# Patient Record
Sex: Male | Born: 1960 | Race: White | Hispanic: No | Marital: Married | Smoking: Former smoker
Health system: Southern US, Community
[De-identification: ages and names within clinical notes are randomized; demographics above are authoritative.]

## PROBLEM LIST (undated history)

## (undated) DIAGNOSIS — L439 Lichen planus, unspecified: Secondary | ICD-10-CM

## (undated) DIAGNOSIS — I10 Essential (primary) hypertension: Secondary | ICD-10-CM

## (undated) HISTORY — DX: Lichen planus, unspecified: L43.9

## (undated) HISTORY — PX: NO PAST SURGERIES: SHX2092

## (undated) HISTORY — DX: Essential (primary) hypertension: I10

---

## 2010-01-11 ENCOUNTER — Ambulatory Visit: Payer: Self-pay | Admitting: Internal Medicine

## 2014-11-11 ENCOUNTER — Other Ambulatory Visit: Payer: Self-pay | Admitting: Family Medicine

## 2014-11-22 ENCOUNTER — Other Ambulatory Visit: Payer: Self-pay | Admitting: Family Medicine

## 2014-12-13 ENCOUNTER — Telehealth: Payer: Self-pay | Admitting: Family Medicine

## 2014-12-13 DIAGNOSIS — I10 Essential (primary) hypertension: Secondary | ICD-10-CM | POA: Insufficient documentation

## 2014-12-13 NOTE — Telephone Encounter (Signed)
Pt called needs refill on Trazadone. Pharm is [facility]. Appt scheduled for 12/21/14 @ 9:45am. Thanks.

## 2014-12-13 NOTE — Telephone Encounter (Signed)
Patient has not been seen in over 1 year, will have to be seen for any medications to be filled

## 2014-12-14 NOTE — Telephone Encounter (Signed)
Pt has appt scheduled for 12/21/14 @ 9:45am. Pt wants to know if enough Trazadone can be called in to last until this appt. Pharm is [facility]. Thanks.

## 2014-12-21 ENCOUNTER — Ambulatory Visit: Payer: Self-pay | Admitting: Family Medicine

## 2014-12-27 ENCOUNTER — Ambulatory Visit: Payer: Self-pay | Admitting: Family Medicine

## 2014-12-28 ENCOUNTER — Ambulatory Visit: Payer: Self-pay | Admitting: Family Medicine

## 2015-02-07 ENCOUNTER — Other Ambulatory Visit: Payer: Self-pay | Admitting: Family Medicine

## 2015-02-07 MED ORDER — LORAZEPAM 1 MG PO TABS: 1.0000 mg | ORAL_TABLET | Freq: Every day | ORAL | Status: DC | PRN

## 2015-02-07 MED ORDER — TRAZODONE HCL 50 MG PO TABS: 25.0000 mg | ORAL_TABLET | Freq: Every evening | ORAL | Status: DC | PRN

## 2015-03-03 ENCOUNTER — Other Ambulatory Visit: Payer: Self-pay | Admitting: Family Medicine

## 2015-05-01 ENCOUNTER — Telehealth: Payer: Self-pay

## 2015-05-01 NOTE — Telephone Encounter (Signed)
No refills patient not seen since 08/2013

## 2015-06-07 ENCOUNTER — Encounter: Payer: Self-pay | Admitting: Family Medicine

## 2015-06-07 ENCOUNTER — Other Ambulatory Visit: Payer: Self-pay | Admitting: Family Medicine

## 2015-06-07 NOTE — Telephone Encounter (Signed)
Letter sent.

## 2015-06-07 NOTE — Telephone Encounter (Signed)
apt 

## 2015-08-17 ENCOUNTER — Encounter: Payer: Self-pay | Admitting: Family Medicine

## 2015-08-17 ENCOUNTER — Ambulatory Visit (INDEPENDENT_AMBULATORY_CARE_PROVIDER_SITE_OTHER): Payer: BLUE CROSS/BLUE SHIELD | Admitting: Family Medicine

## 2015-08-17 VITALS — BP 146/91 | HR 88 | Temp 98.5°F | Ht 69.7 in | Wt 240.0 lb

## 2015-08-17 DIAGNOSIS — Z13220 Encounter for screening for lipoid disorders: Secondary | ICD-10-CM | POA: Diagnosis not present

## 2015-08-17 DIAGNOSIS — Z125 Encounter for screening for malignant neoplasm of prostate: Secondary | ICD-10-CM

## 2015-08-17 DIAGNOSIS — Z1159 Encounter for screening for other viral diseases: Secondary | ICD-10-CM

## 2015-08-17 DIAGNOSIS — I10 Essential (primary) hypertension: Secondary | ICD-10-CM | POA: Diagnosis not present

## 2015-08-17 DIAGNOSIS — G4700 Insomnia, unspecified: Secondary | ICD-10-CM | POA: Insufficient documentation

## 2015-08-17 LAB — UA/M W/RFLX CULTURE, ROUTINE
BILIRUBIN UA: NEGATIVE
Glucose, UA: NEGATIVE
KETONES UA: NEGATIVE
Leukocytes, UA: NEGATIVE
Nitrite, UA: NEGATIVE
PH UA: 5 (ref 5.0–7.5)
PROTEIN UA: NEGATIVE
RBC UA: NEGATIVE
SPEC GRAV UA: 1.01 (ref 1.005–1.030)
Urobilinogen, Ur: 0.2 mg/dL (ref 0.2–1.0)

## 2015-08-17 LAB — MICROALBUMIN, URINE WAIVED
Creatinine, Urine Waived: 100 mg/dL (ref 10–300)
Microalb, Ur Waived: 10 mg/L (ref 0–19)
Microalb/Creat Ratio: <30 mg/g (ref <30)

## 2015-08-17 MED ORDER — BENAZEPRIL HCL 40 MG PO TABS: ORAL_TABLET | ORAL | Status: DC

## 2015-08-17 MED ORDER — TRAZODONE HCL 100 MG PO TABS: 100.0000 mg | ORAL_TABLET | Freq: Every evening | ORAL | Status: DC | PRN

## 2015-08-17 MED ORDER — AMLODIPINE BESYLATE 5 MG PO TABS: ORAL_TABLET | ORAL | Status: DC

## 2015-08-17 NOTE — Assessment & Plan Note (Signed)
Not under good control today as he's out of his benazepril. Refill given. Recheck at physical in 3 months.

## 2015-08-17 NOTE — Assessment & Plan Note (Signed)
Did better on the 100mg  of the trazodone. Refill given. Call with concerns.

## 2015-08-17 NOTE — Progress Notes (Signed)
BP 146/91 mmHg  Pulse 88  Temp(Src) 98.5 F (36.9 C)  Ht 5' 9.7" (1.77 m)  Wt 240 lb (108.863 kg)  BMI 34.75 kg/m2  SpO2 95%   Subjective:    Patient ID: Jason Huerta, male    DOB: 1961-02-15, 55 y.o.   MRN: [id number]  HPI: Jason Huerta is a 55 y.o. male  Chief Complaint  Patient presents with  . Hypertension    Patient has been out of his Benazepril for the last week  . Insomnia    Patient states that he was on Trazodone 100mg , which worked fine.   HYPERTENSION- has been out of his benazepril for a week Hypertension status: stable  Satisfied with current treatment? yes Duration of hypertension: chronic BP monitoring frequency:  a few times a week BP range: 110s/70s BP medication side effects:  no Medication compliance: good compliance Aspirin: no Recurrent headaches: no Visual changes: no Palpitations: no Dyspnea: no Chest pain: no Lower extremity edema: no Dizzy/lightheaded: no  INSOMNIA Duration: chronic Satisfied with sleep quality: yes Difficulty falling asleep: no Difficulty staying asleep: no Waking a few hours after sleep onset: no Early morning awakenings: no Daytime hypersomnolence: no Wakes feeling refreshed: yes Good sleep hygiene: yes Apnea: no Snoring: no Depressed/anxious mood: no Recent stress: no Restless legs/nocturnal leg cramps: no Chronic pain/arthritis: no History of sleep study: no Treatments attempted: did well on the trazodone 100mg    Relevant past medical, surgical, family and social history reviewed and updated as indicated. Interim medical history since our last visit reviewed. Allergies and medications reviewed and updated.  Review of Systems  Constitutional: Negative.   Respiratory: Negative.   Cardiovascular: Negative.   Psychiatric/Behavioral: Negative.     Per HPI unless specifically indicated above     Objective:    BP 146/91 mmHg  Pulse 88  Temp(Src) 98.5 F (36.9 C)  Ht 5' 9.7" (1.77 m)  Wt 240 lb  (108.863 kg)  BMI 34.75 kg/m2  SpO2 95%  Wt Readings from Last 3 Encounters:  08/17/15 240 lb (108.863 kg)  08/26/13 237 lb (107.502 kg)    Physical Exam  Constitutional: He is oriented to person, place, and time. He appears well-developed and well-nourished. No distress.  HENT:  Head: Normocephalic and atraumatic.  Right Ear: Hearing normal.  Left Ear: Hearing normal.  Nose: Nose normal.  Eyes: Conjunctivae and lids are normal. Right eye exhibits no discharge. Left eye exhibits no discharge. No scleral icterus.  Cardiovascular: Normal rate, regular rhythm, normal heart sounds and intact distal pulses.  Exam reveals no gallop and no friction rub.   No murmur heard. Pulmonary/Chest: Effort normal and breath sounds normal. No respiratory distress. He has no wheezes. He has no rales. He exhibits no tenderness.  Musculoskeletal: Normal range of motion.  Neurological: He is alert and oriented to person, place, and time.  Skin: Skin is warm, dry and intact. No rash noted. No erythema. No pallor.  Psychiatric: He has a normal mood and affect. His speech is normal and behavior is normal. Judgment and thought content normal. Cognition and memory are normal.  Nursing note and vitals reviewed.   No results found for this or any previous visit.    Assessment & Plan:   Problem List Items Addressed This Visit      Cardiovascular and Mediastinum   Hypertension - Primary    Not under good control today as he's out of his benazepril. Refill given. Recheck at physical in 3 months.  Relevant Medications   benazepril (LOTENSIN) 40 MG tablet   amLODipine (NORVASC) 5 MG tablet   Other Relevant Orders   Comprehensive metabolic panel   Microalbumin, Urine Waived   TSH   UA/M w/rflx Culture, Routine     Other   Insomnia    Did better on the 100mg  of the trazodone. Refill given. Call with concerns.       Relevant Orders   CBC with Differential/Platelet   TSH    Other Visit Diagnoses     Screening for cholesterol level        Labs drawn today, await results    Relevant Orders    Lipid Panel w/o Chol/HDL Ratio    Screening for prostate cancer        Labs drawn today, await results    Relevant Orders    PSA    Need for hepatitis C screening test        Labs drawn today, await results    Relevant Orders    Hepatitis C Antibody        Follow up plan: Return As scheduled for Physical in Oct with [facility].

## 2015-08-18 ENCOUNTER — Encounter: Payer: Self-pay | Admitting: Family Medicine

## 2015-08-18 LAB — COMPREHENSIVE METABOLIC PANEL
ALK PHOS: 93 IU/L (ref 39–117)
ALT: 14 IU/L (ref 0–44)
AST: 18 IU/L (ref 0–40)
Albumin/Globulin Ratio: 1.8 (ref 1.2–2.2)
Albumin: 4.2 g/dL (ref 3.5–5.5)
BUN/Creatinine Ratio: 13 (ref 9–20)
BUN: 13 mg/dL (ref 6–24)
Bilirubin Total: 0.4 mg/dL (ref 0.0–1.2)
CO2: 20 mmol/L (ref 18–29)
CREATININE: 0.98 mg/dL (ref 0.76–1.27)
Calcium: 9.1 mg/dL (ref 8.7–10.2)
Chloride: 99 mmol/L (ref 96–106)
GFR calc Af Amer: 99 mL/min/{1.73_m2} (ref >59)
GFR, EST NON AFRICAN AMERICAN: 86 mL/min/{1.73_m2} (ref >59)
Globulin, Total: 2.3 g/dL (ref 1.5–4.5)
Glucose: 133 mg/dL — ABNORMAL HIGH (ref 65–99)
Potassium: 4.3 mmol/L (ref 3.5–5.2)
Sodium: 138 mmol/L (ref 134–144)
Total Protein: 6.5 g/dL (ref 6.0–8.5)

## 2015-08-18 LAB — CBC WITH DIFFERENTIAL/PLATELET
BASOS: 1 %
Basophils Absolute: 0.1 10*3/uL (ref 0.0–0.2)
EOS (ABSOLUTE): 0.3 10*3/uL (ref 0.0–0.4)
EOS: 5 %
HEMATOCRIT: 41.8 % (ref 37.5–51.0)
Hemoglobin: 14.5 g/dL (ref 12.6–17.7)
Immature Grans (Abs): 0 10*3/uL (ref 0.0–0.1)
Immature Granulocytes: 0 %
LYMPHS ABS: 1.9 10*3/uL (ref 0.7–3.1)
Lymphs: 28 %
MCH: 29.4 pg (ref 26.6–33.0)
MCHC: 34.7 g/dL (ref 31.5–35.7)
MCV: 85 fL (ref 79–97)
MONOS ABS: 0.6 10*3/uL (ref 0.1–0.9)
Monocytes: 9 %
NEUTROS ABS: 3.9 10*3/uL (ref 1.4–7.0)
Neutrophils: 57 %
Platelets: 187 10*3/uL (ref 150–379)
RBC: 4.94 x10E6/uL (ref 4.14–5.80)
RDW: 13.5 % (ref 12.3–15.4)
WBC: 6.8 10*3/uL (ref 3.4–10.8)

## 2015-08-18 LAB — LIPID PANEL W/O CHOL/HDL RATIO
CHOLESTEROL TOTAL: 148 mg/dL (ref 100–199)
HDL: 34 mg/dL — AB (ref >39)
LDL CALC: 81 mg/dL (ref 0–99)
TRIGLYCERIDES: 167 mg/dL — AB (ref 0–149)
VLDL Cholesterol Cal: 33 mg/dL (ref 5–40)

## 2015-08-18 LAB — TSH: TSH: 1.58 u[IU]/mL (ref 0.450–4.500)

## 2015-08-18 LAB — PSA: PROSTATE SPECIFIC AG, SERUM: 1.1 ng/mL (ref 0.0–4.0)

## 2015-08-23 LAB — SPECIMEN STATUS REPORT

## 2015-08-23 LAB — HEPATITIS C ANTIBODY: Hep C Virus Ab: <0.1 s/co ratio (ref 0.0–0.9)

## 2015-11-20 ENCOUNTER — Encounter: Payer: Self-pay | Admitting: Family Medicine

## 2015-11-20 ENCOUNTER — Ambulatory Visit (INDEPENDENT_AMBULATORY_CARE_PROVIDER_SITE_OTHER): Payer: BLUE CROSS/BLUE SHIELD | Admitting: Family Medicine

## 2015-11-20 VITALS — BP 115/73 | HR 93 | Temp 98.2°F | Wt 249.0 lb

## 2015-11-20 DIAGNOSIS — J069 Acute upper respiratory infection, unspecified: Secondary | ICD-10-CM

## 2015-11-20 NOTE — Progress Notes (Signed)
BP 115/73 (BP Location: Left Arm, Patient Position: Sitting, Cuff Size: Normal)   Pulse 93   Temp 98.2 F (36.8 C)   Wt 249 lb (112.9 kg) Comment: with shoes  SpO2 96%   BMI 36.04 kg/m    Subjective:    Patient ID: Jason Huerta, male    DOB: 23-Sep-1960, 55 y.o.   MRN: [id number]  HPI: TREYLIN BURTCH is a 55 y.o. male  Chief Complaint  Patient presents with  . Sinusitis    x 3 days, progressive each day, now feels in chest   Sinus pain and pressure, cough, nasal congestion x 3 days. Denies ST, ear pain, fevers, N/V, CP, SOB, or wheezing. Has been taking allergy pills, dayquil/nyquil, and saline nasal spray. No sick contacts. Thinks all of this started after cleaning a very dirty light fixture.   Relevant past medical, surgical, family and social history reviewed and updated as indicated. Interim medical history since our last visit reviewed. Allergies and medications reviewed and updated.  Review of Systems  Constitutional: Negative for chills, diaphoresis and fever.  HENT: Positive for congestion, rhinorrhea and sinus pressure. Negative for ear discharge, ear pain and sore throat.   Eyes: Negative.   Respiratory: Positive for cough. Negative for chest tightness, shortness of breath and wheezing.   Cardiovascular: Negative.   Gastrointestinal: Negative.   Genitourinary: Negative.   Musculoskeletal: Negative.   Skin: Negative.   Neurological: Negative.   Psychiatric/Behavioral: Negative.     Per HPI unless specifically indicated above     Objective:    BP 115/73 (BP Location: Left Arm, Patient Position: Sitting, Cuff Size: Normal)   Pulse 93   Temp 98.2 F (36.8 C)   Wt 249 lb (112.9 kg) Comment: with shoes  SpO2 96%   BMI 36.04 kg/m   Wt Readings from Last 3 Encounters:  11/20/15 249 lb (112.9 kg)  08/17/15 240 lb (108.9 kg)  08/26/13 237 lb (107.5 kg)    Physical Exam  Constitutional: He is oriented to person, place, and time. He appears well-developed  and well-nourished. No distress.  HENT:  Head: Normocephalic and atraumatic.  Right Ear: External ear normal.  Left Ear: External ear normal.  Nose: Nose normal.  Mouth/Throat: Oropharynx is clear and moist. No oropharyngeal exudate.  No sinus TTP  Eyes: Conjunctivae are normal. Pupils are equal, round, and reactive to light. No scleral icterus.  Neck: Normal range of motion. Neck supple.  Cardiovascular: Normal rate, regular rhythm and normal heart sounds.   Pulmonary/Chest: Effort normal and breath sounds normal. No respiratory distress. He has no wheezes. He has no rales.  Musculoskeletal: Normal range of motion.  Lymphadenopathy:    He has no cervical adenopathy.  Neurological: He is alert and oriented to person, place, and time.  Skin: Skin is warm and dry.  Nursing note and vitals reviewed.     Assessment & Plan:   Problem List Items Addressed This Visit    None    Visit Diagnoses    Upper respiratory infection    -  Primary   Likely symptoms are irritant-related given inciting exposure, discussed continuing allergy regimen, sinus rinses, vapor rub, and cough suppressants.     Patient became exceedingly agitated that he was not getting an antibiotic, demanded an antibiotic saying this is wasting his time. Attempted to discuss antibiotic guidelines and safe practice given his duration, lack of fever, clear breath sounds, etc but patient stormed out of exam room mid-sentence and proceeded  to say that he was leaving the practice.   Follow up plan: Return if symptoms worsen or fail to improve.

## 2015-11-20 NOTE — Patient Instructions (Signed)
Follow up as needed

## 2015-11-27 NOTE — Progress Notes (Signed)
Sometimes going ahead and giving up patient a prescription with directions to start in 3 days if getting worse can avoid this fight.

## 2015-12-21 ENCOUNTER — Encounter: Payer: Self-pay | Admitting: Family Medicine

## 2016-05-06 ENCOUNTER — Ambulatory Visit
Admission: EM | Admit: 2016-05-06 | Discharge: 2016-05-06 | Disposition: A | Discharge status: Home / Self Care | Payer: BLUE CROSS/BLUE SHIELD | Attending: Family Medicine | Admitting: Family Medicine

## 2016-05-06 DIAGNOSIS — M62830 Muscle spasm of back: Secondary | ICD-10-CM

## 2016-05-06 DIAGNOSIS — M5440 Lumbago with sciatica, unspecified side: Secondary | ICD-10-CM | POA: Diagnosis not present

## 2016-05-06 MED ORDER — METHYLPREDNISOLONE SODIUM SUCC 125 MG IJ SOLR
125.0000 mg | Freq: Once | INTRAMUSCULAR | Status: AC
  Administered 2016-05-06: 125 mg via INTRAMUSCULAR

## 2016-05-06 MED ORDER — MELOXICAM 15 MG PO TABS: 15.0000 mg | ORAL_TABLET | Freq: Every day | ORAL | 0 refills | Status: DC

## 2016-05-06 MED ORDER — PREDNISONE 10 MG (21) PO TBPK: ORAL_TABLET | ORAL | 0 refills | Status: DC

## 2016-05-06 MED ORDER — KETOROLAC TROMETHAMINE 60 MG/2ML IM SOLN
60.0000 mg | Freq: Once | INTRAMUSCULAR | Status: AC
  Administered 2016-05-06: 60 mg via INTRAMUSCULAR

## 2016-05-06 MED ORDER — ORPHENADRINE CITRATE ER 100 MG PO TB12: 100.0000 mg | ORAL_TABLET | Freq: Two times a day (BID) | ORAL | 0 refills | Status: DC

## 2016-05-06 NOTE — ED Triage Notes (Signed)
Patient states that he has lower back pain on the right side that started Friday night. Patient states that he had this a few years ago and improved with prednisone.

## 2016-05-06 NOTE — ED Provider Notes (Signed)
[facility]    CSN: [id number] Arrival date & time: 05/06/16  1138     History   Chief Complaint Chief Complaint  Patient presents with  . Back Pain    lumbar    HPI Jason Huerta is a 56 y.o. male.   Patient's here because of bilateral back pain. The pain started on the mid upper right back after he sneezed he had some pain and discomfort unless side now he has pain on both lower back with the left being probably worse than right is some pain going down to his legs." Spell coming to the urgent care when he has something like this and was put on prednisone and states was a miracle drug. He reports coming in here in a unable to walk and left after 2 days walking fine. Denies any direct trauma to the back used a had MRI either he feels tightening sensation along both sides the lower lumbar spine and over the iliac sacral area. No previous surgery. He has a history of hypertension and insomnia. No known drug allergies father had colon cancer hypertension and a myocardial infarction. He is a former smoker.   The history is provided by the patient. No language interpreter was used.  Back Pain  Location:  Sacro-iliac joint and lumbar spine Quality:  Aching Radiates to:  L thigh and R thigh Pain severity:  Moderate Pain is:  Same all the time Duration:  4 days Timing:  Constant Progression:  Worsening Chronicity:  New Relieved by:  Nothing Ineffective treatments:  None tried   Past Medical History:  Diagnosis Date  . Hypertension   . Lichen planus     Patient Active Problem List   Diagnosis Date Noted  . Insomnia 08/17/2015  . Hypertension     Past Surgical History:  Procedure Laterality Date  . NO PAST SURGERIES         Home Medications    Prior to Admission medications   Medication Sig Start Date End Date Taking? Authorizing Provider  amLODipine (NORVASC) 5 MG tablet TAKE ONE (1) TABLET EACH DAY 08/17/15  Yes Megan P Johnson, DO  benazepril  (LOTENSIN) 40 MG tablet TAKE ONE (1) TABLET EACH DAY 08/17/15  Yes Megan P Johnson, DO  traZODone (DESYREL) 100 MG tablet Take 1 tablet (100 mg total) by mouth at bedtime as needed for sleep. 08/17/15  Yes Megan P Johnson, DO  meloxicam (MOBIC) 15 MG tablet Take 1 tablet (15 mg total) by mouth daily. 05/06/16   Hassan Rowan, MD  orphenadrine (NORFLEX) 100 MG tablet Take 1 tablet (100 mg total) by mouth 2 (two) times daily. 05/06/16   Hassan Rowan, MD  predniSONE (STERAPRED UNI-PAK 21 TAB) 10 MG (21) TBPK tablet 6 tabs day 1 and 2, 5 tabs day 3 and 4, 4 tabs day 5 and 6, 3 tabs day 7 and 8, 2 tabs day 9 and 10, 1 tab day 11 and 12. Take orally 05/06/16   Hassan Rowan, MD  sildenafil (REVATIO) 20 MG tablet Take 20 mg by mouth 3 (three) times daily.    Historical Provider, MD    Family History Family History  Problem Relation Age of Onset  . Cancer Father     colon  . Heart attack Father   . Hypertension Father     Social History Social History  Substance Use Topics  . Smoking status: Former Games developer  . Smokeless tobacco: Never Used  . Alcohol use No  Allergies   Patient has no known allergies.   Review of Systems Review of Systems  Musculoskeletal: Positive for back pain and myalgias.  All other systems reviewed and are negative.    Physical Exam Triage Vital Signs ED Triage Vitals  Enc Vitals Group     BP 05/06/16 1332 130/77     Pulse Rate 05/06/16 1332 97     Resp 05/06/16 1332 18     Temp 05/06/16 1332 97.8 F (36.6 C)     Temp Source 05/06/16 1332 Oral     SpO2 05/06/16 1332 98 %     Weight 05/06/16 1330 250 lb (113.4 kg)     Height 05/06/16 1330 6' (1.829 m)     Head Circumference --      Peak Flow --      Pain Score 05/06/16 1331 8     Pain Loc --      Pain Edu? --      Excl. in GC? --    No data found.   Updated Vital Signs BP 130/77 (BP Location: Left Arm)   Pulse 97   Temp 97.8 F (36.6 C) (Oral)   Resp 18   Ht 6' (1.829 m)   Wt 250 lb (113.4 kg)    SpO2 98%   BMI 33.91 kg/m   Visual Acuity Right Eye Distance:   Left Eye Distance:   Bilateral Distance:    Right Eye Near:   Left Eye Near:    Bilateral Near:     Physical Exam  Constitutional: He is oriented to person, place, and time. He appears well-developed and well-nourished.  HENT:  Head: Normocephalic and atraumatic.  Eyes: Pupils are equal, round, and reactive to light.  Neck: Neck supple.  Pulmonary/Chest: Effort normal.  Musculoskeletal: He exhibits tenderness. He exhibits no edema or deformity.  Neurological: He is alert and oriented to person, place, and time. No cranial nerve deficit.  Skin: Skin is warm.  Psychiatric: He has a normal mood and affect.  Vitals reviewed.    UC Treatments / Results  Labs (all labs ordered are listed, but only abnormal results are displayed) Labs Reviewed - No data to display  EKG  EKG Interpretation None       Radiology No results found.  Procedures Procedures (including critical care time)  Medications Ordered in UC Medications  ketorolac (TORADOL) injection 60 mg (60 mg Intramuscular Given 05/06/16 1441)  methylPREDNISolone sodium succinate (SOLU-MEDROL) 125 mg/2 mL injection 125 mg (125 mg Intramuscular Given 05/06/16 1441)     Initial Impression / Assessment and Plan / UC Course  I have reviewed the triage vital signs and the nursing notes.  Pertinent labs & imaging results that were available during my care of the patient were reviewed by me and considered in my medical decision making (see chart for details).   will Mr. 64 Toradol IM 105 mg Solu-Medrol IM as well. We'll place motor 12 days steroid pack suspect prednisone helped somewhat the past. Since the pain started when he sneezed and it was no history of external trauma we'll x-ray his lumbar spine B is aware that he probably does need to follow with Dr. Donaciano Eva in the near future may need to have MRI. As outpatient will place him on Mobic 15 mg 1 tablet  day and Norflex 100 mg twice a day and the 12 day course prednisone by mouth. Will give a work note for today and tomorrow as well. Final Clinical Impressions(s) /  UC Diagnoses   Final diagnoses:  Low back pain with sciatica, sciatica laterality unspecified, unspecified back pain laterality, unspecified chronicity  Muscle spasm of back    New Prescriptions Discharge Medication List as of 05/06/2016  2:54 PM    START taking these medications   Details  meloxicam (MOBIC) 15 MG tablet Take 1 tablet (15 mg total) by mouth daily., Starting Mon 05/06/2016, Normal    orphenadrine (NORFLEX) 100 MG tablet Take 1 tablet (100 mg total) by mouth 2 (two) times daily., Starting Mon 05/06/2016, Normal    predniSONE (STERAPRED UNI-PAK 21 TAB) 10 MG (21) TBPK tablet 6 tabs day 1 and 2, 5 tabs day 3 and 4, 4 tabs day 5 and 6, 3 tabs day 7 and 8, 2 tabs day 9 and 10, 1 tab day 11 and 12. Take orally, Normal         Note: This dictation was prepared with Dragon dictation along with smaller phrase technology. Any transcriptional errors that result from this process are unintentional.   Hassan RowanEugene Janelly Switalski, MD 05/06/16 1511

## 2016-12-23 ENCOUNTER — Other Ambulatory Visit: Payer: Self-pay | Admitting: Family Medicine

## 2017-03-05 ENCOUNTER — Other Ambulatory Visit: Payer: Self-pay | Admitting: Family Medicine

## 2017-03-05 NOTE — Telephone Encounter (Signed)
apt 

## 2017-03-25 ENCOUNTER — Encounter: Payer: Self-pay | Admitting: Family Medicine

## 2017-03-25 ENCOUNTER — Encounter: Payer: BLUE CROSS/BLUE SHIELD | Admitting: Family Medicine

## 2017-03-25 ENCOUNTER — Ambulatory Visit (INDEPENDENT_AMBULATORY_CARE_PROVIDER_SITE_OTHER): Payer: BLUE CROSS/BLUE SHIELD | Admitting: Family Medicine

## 2017-03-25 VITALS — BP 133/81 | HR 83 | Ht 70.87 in | Wt 247.0 lb

## 2017-03-25 DIAGNOSIS — G4700 Insomnia, unspecified: Secondary | ICD-10-CM | POA: Diagnosis not present

## 2017-03-25 DIAGNOSIS — N529 Male erectile dysfunction, unspecified: Secondary | ICD-10-CM | POA: Diagnosis not present

## 2017-03-25 DIAGNOSIS — Z1329 Encounter for screening for other suspected endocrine disorder: Secondary | ICD-10-CM

## 2017-03-25 DIAGNOSIS — I10 Essential (primary) hypertension: Secondary | ICD-10-CM | POA: Diagnosis not present

## 2017-03-25 DIAGNOSIS — Z125 Encounter for screening for malignant neoplasm of prostate: Secondary | ICD-10-CM | POA: Diagnosis not present

## 2017-03-25 DIAGNOSIS — Z0001 Encounter for general adult medical examination with abnormal findings: Secondary | ICD-10-CM

## 2017-03-25 DIAGNOSIS — Z13220 Encounter for screening for lipoid disorders: Secondary | ICD-10-CM

## 2017-03-25 DIAGNOSIS — Z114 Encounter for screening for human immunodeficiency virus [HIV]: Secondary | ICD-10-CM | POA: Diagnosis not present

## 2017-03-25 DIAGNOSIS — Z0000 Encounter for general adult medical examination without abnormal findings: Secondary | ICD-10-CM

## 2017-03-25 DIAGNOSIS — Z131 Encounter for screening for diabetes mellitus: Secondary | ICD-10-CM

## 2017-03-25 LAB — URINALYSIS, ROUTINE W REFLEX MICROSCOPIC
Bilirubin, UA: NEGATIVE
GLUCOSE, UA: NEGATIVE
KETONES UA: NEGATIVE
LEUKOCYTES UA: NEGATIVE
Nitrite, UA: NEGATIVE
Protein, UA: NEGATIVE
RBC UA: NEGATIVE
Specific Gravity, UA: 1.02 (ref 1.005–1.030)
UUROB: 1 mg/dL (ref 0.2–1.0)
pH, UA: 6 (ref 5.0–7.5)

## 2017-03-25 MED ORDER — TRAZODONE HCL 100 MG PO TABS: 100.0000 mg | ORAL_TABLET | Freq: Every day | ORAL | 12 refills | Status: DC

## 2017-03-25 MED ORDER — AMLODIPINE BESYLATE 5 MG PO TABS: 5.0000 mg | ORAL_TABLET | Freq: Every day | ORAL | 12 refills | Status: DC

## 2017-03-25 MED ORDER — BENAZEPRIL HCL 40 MG PO TABS: 40.0000 mg | ORAL_TABLET | Freq: Every day | ORAL | 12 refills | Status: DC

## 2017-03-25 NOTE — Assessment & Plan Note (Signed)
The current medical regimen is effective;  continue present plan and medications.  

## 2017-03-25 NOTE — Assessment & Plan Note (Signed)
Discuss ED issues.

## 2017-03-25 NOTE — Progress Notes (Signed)
BP 133/81   Pulse 83   Ht 5' 10.87" (1.8 m)   Wt 247 lb (112 kg)   SpO2 99%   BMI 34.58 kg/m    Subjective:    Patient ID: Jason Huerta, male    DOB: December 18, 1960, 57 y.o.   MRN: [id number]  HPI: Jason Huerta is a 57 y.o. male  Chief Complaint  Patient presents with  . Annual Exam  Taking blood pressure without problems patient all in all doing well taking blood pressure medications without problems.  Also takes trazodone without problems. Takes occasional's identified L and is interested in something that may be more reliable.  Patient with no cardiovascular symptoms is able to exercise without limits no PND orthopnea shortness of breath etc.  Relevant past medical, surgical, family and social history reviewed and updated as indicated. Interim medical history since our last visit reviewed. Allergies and medications reviewed and updated.  Review of Systems  Constitutional: Negative.   HENT: Negative.   Eyes: Negative.   Respiratory: Negative.   Cardiovascular: Negative.   Gastrointestinal: Negative.   Endocrine: Negative.   Genitourinary: Negative.   Musculoskeletal: Negative.   Skin: Negative.   Allergic/Immunologic: Negative.   Neurological: Negative.   Hematological: Negative.   Psychiatric/Behavioral: Negative.     Per HPI unless specifically indicated above     Objective:    BP 133/81   Pulse 83   Ht 5' 10.87" (1.8 m)   Wt 247 lb (112 kg)   SpO2 99%   BMI 34.58 kg/m   Wt Readings from Last 3 Encounters:  03/25/17 247 lb (112 kg)  05/06/16 250 lb (113.4 kg)  11/20/15 249 lb (112.9 kg)    Physical Exam  Constitutional: He is oriented to person, place, and time. He appears well-developed and well-nourished.  HENT:  Head: Normocephalic and atraumatic.  Right Ear: External ear normal.  Left Ear: External ear normal.  Eyes: Conjunctivae and EOM are normal. Pupils are equal, round, and reactive to light.  Neck: Normal range of motion. Neck supple.    Cardiovascular: Normal rate, regular rhythm, normal heart sounds and intact distal pulses.  Pulmonary/Chest: Effort normal and breath sounds normal.  Abdominal: Soft. Bowel sounds are normal. There is no splenomegaly or hepatomegaly.  Genitourinary: Rectum normal, prostate normal and penis normal.  Musculoskeletal: Normal range of motion.  Neurological: He is alert and oriented to person, place, and time. He has normal reflexes.  Skin: No rash noted. No erythema.  Psychiatric: He has a normal mood and affect. His behavior is normal. Judgment and thought content normal.    Results for orders placed or performed in visit on 03/25/17  Urinalysis, Routine w reflex microscopic  Result Value Ref Range   Specific Gravity, UA 1.020 1.005 - 1.030   pH, UA 6.0 5.0 - 7.5   Color, UA Yellow Yellow   Appearance Ur Clear Clear   Leukocytes, UA Negative Negative   Protein, UA Negative Negative/Trace   Glucose, UA Negative Negative   Ketones, UA Negative Negative   RBC, UA Negative Negative   Bilirubin, UA Negative Negative   Urobilinogen, Ur 1.0 0.2 - 1.0 mg/dL   Nitrite, UA Negative Negative      Assessment & Plan:   Problem List Items Addressed This Visit      Cardiovascular and Mediastinum   Hypertension - Primary    The current medical regimen is effective;  continue present plan and medications.  Relevant Medications   benazepril (LOTENSIN) 40 MG tablet   amLODipine (NORVASC) 5 MG tablet   Other Relevant Orders   CBC with Differential/Platelet   Comprehensive metabolic panel   Urinalysis, Routine w reflex microscopic (Completed)     Genitourinary   ED (erectile dysfunction)    Discuss ED issues.        Other   Insomnia    The current medical regimen is effective;  continue present plan and medications.        Other Visit Diagnoses    Encounter for screening for HIV       Relevant Orders   HIV antibody (with reflex)   Prostate cancer screening       Relevant  Orders   PSA   Thyroid disorder screen       Relevant Orders   TSH   Screening for diabetes mellitus (DM)       Relevant Orders   CBC with Differential/Platelet   Comprehensive metabolic panel   Urinalysis, Routine w reflex microscopic (Completed)   Screening cholesterol level       Relevant Orders   Lipid panel       Follow up plan: Return in about 6 months (around 09/22/2017) for BMP.

## 2017-03-26 ENCOUNTER — Telehealth: Payer: Self-pay | Admitting: Family Medicine

## 2017-03-26 DIAGNOSIS — R972 Elevated prostate specific antigen [PSA]: Secondary | ICD-10-CM

## 2017-03-26 LAB — COMPREHENSIVE METABOLIC PANEL WITH GFR
ALT: 23 IU/L (ref 0–44)
AST: 23 IU/L (ref 0–40)
Albumin/Globulin Ratio: 2 (ref 1.2–2.2)
Albumin: 4.7 g/dL (ref 3.5–5.5)
Alkaline Phosphatase: 85 IU/L (ref 39–117)
BUN/Creatinine Ratio: 9 (ref 9–20)
BUN: 8 mg/dL (ref 6–24)
Bilirubin Total: 0.7 mg/dL (ref 0.0–1.2)
CO2: 24 mmol/L (ref 20–29)
Calcium: 9.7 mg/dL (ref 8.7–10.2)
Chloride: 98 mmol/L (ref 96–106)
Creatinine, Ser: 0.87 mg/dL (ref 0.76–1.27)
GFR calc Af Amer: 110 mL/min/1.73
GFR calc non Af Amer: 95 mL/min/1.73
Globulin, Total: 2.4 g/dL (ref 1.5–4.5)
Glucose: 97 mg/dL (ref 65–99)
Potassium: 3.7 mmol/L (ref 3.5–5.2)
Sodium: 142 mmol/L (ref 134–144)
Total Protein: 7.1 g/dL (ref 6.0–8.5)

## 2017-03-26 LAB — TSH: TSH: 1.88 u[IU]/mL (ref 0.450–4.500)

## 2017-03-26 LAB — LIPID PANEL
Chol/HDL Ratio: 4.4 ratio (ref 0.0–5.0)
Cholesterol, Total: 142 mg/dL (ref 100–199)
HDL: 32 mg/dL — ABNORMAL LOW
LDL Calculated: 72 mg/dL (ref 0–99)
Triglycerides: 188 mg/dL — ABNORMAL HIGH (ref 0–149)
VLDL Cholesterol Cal: 38 mg/dL (ref 5–40)

## 2017-03-26 LAB — CBC WITH DIFFERENTIAL/PLATELET
Basophils Absolute: 0.1 x10E3/uL (ref 0.0–0.2)
Basos: 1 %
EOS (ABSOLUTE): 0.2 x10E3/uL (ref 0.0–0.4)
Eos: 3 %
Hematocrit: 41 % (ref 37.5–51.0)
Hemoglobin: 14.4 g/dL (ref 13.0–17.7)
Immature Grans (Abs): 0 x10E3/uL (ref 0.0–0.1)
Immature Granulocytes: 0 %
Lymphocytes Absolute: 1.6 x10E3/uL (ref 0.7–3.1)
Lymphs: 22 %
MCH: 30 pg (ref 26.6–33.0)
MCHC: 35.1 g/dL (ref 31.5–35.7)
MCV: 85 fL (ref 79–97)
Monocytes Absolute: 0.8 x10E3/uL (ref 0.1–0.9)
Monocytes: 12 %
Neutrophils Absolute: 4.3 x10E3/uL (ref 1.4–7.0)
Neutrophils: 62 %
Platelets: 214 x10E3/uL (ref 150–379)
RBC: 4.8 x10E6/uL (ref 4.14–5.80)
RDW: 14.1 % (ref 12.3–15.4)
WBC: 7 x10E3/uL (ref 3.4–10.8)

## 2017-03-26 LAB — HIV ANTIBODY (ROUTINE TESTING W REFLEX): HIV Screen 4th Generation wRfx: NONREACTIVE

## 2017-03-26 LAB — PSA: Prostate Specific Ag, Serum: 3.1 ng/mL (ref 0.0–4.0)

## 2017-03-26 NOTE — Telephone Encounter (Signed)
Phone call Discussed with patient PSA normal but increased 2 points this year.  Will recheck PSA in 3-4 months.

## 2017-05-15 ENCOUNTER — Encounter: Payer: BLUE CROSS/BLUE SHIELD | Admitting: Family Medicine

## 2017-05-26 DIAGNOSIS — N401 Enlarged prostate with lower urinary tract symptoms: Secondary | ICD-10-CM | POA: Diagnosis not present

## 2017-05-26 DIAGNOSIS — Z79899 Other long term (current) drug therapy: Secondary | ICD-10-CM | POA: Diagnosis not present

## 2017-05-26 DIAGNOSIS — E291 Testicular hypofunction: Secondary | ICD-10-CM | POA: Diagnosis not present

## 2017-05-26 DIAGNOSIS — N5201 Erectile dysfunction due to arterial insufficiency: Secondary | ICD-10-CM | POA: Diagnosis not present

## 2017-05-28 DIAGNOSIS — N5201 Erectile dysfunction due to arterial insufficiency: Secondary | ICD-10-CM | POA: Diagnosis not present

## 2017-06-04 DIAGNOSIS — E291 Testicular hypofunction: Secondary | ICD-10-CM | POA: Diagnosis not present

## 2017-06-04 DIAGNOSIS — N5201 Erectile dysfunction due to arterial insufficiency: Secondary | ICD-10-CM | POA: Diagnosis not present

## 2017-07-03 ENCOUNTER — Other Ambulatory Visit: Payer: Self-pay | Admitting: Family Medicine

## 2017-07-03 MED ORDER — AZITHROMYCIN 250 MG PO TABS: ORAL_TABLET | ORAL | 0 refills | Status: DC

## 2017-07-03 MED ORDER — BENZONATATE 100 MG PO CAPS: 100.0000 mg | ORAL_CAPSULE | Freq: Two times a day (BID) | ORAL | 0 refills | Status: DC | PRN

## 2017-10-31 ENCOUNTER — Other Ambulatory Visit: Payer: Self-pay

## 2017-10-31 ENCOUNTER — Ambulatory Visit
Admission: EM | Admit: 2017-10-31 | Discharge: 2017-10-31 | Disposition: A | Discharge status: Home / Self Care | Payer: BLUE CROSS/BLUE SHIELD | Attending: Emergency Medicine | Admitting: Emergency Medicine

## 2017-10-31 DIAGNOSIS — J029 Acute pharyngitis, unspecified: Secondary | ICD-10-CM

## 2017-10-31 DIAGNOSIS — B9789 Other viral agents as the cause of diseases classified elsewhere: Secondary | ICD-10-CM

## 2017-10-31 LAB — RAPID STREP SCREEN (MED CTR MEBANE ONLY): Streptococcus, Group A Screen (Direct): NEGATIVE

## 2017-10-31 MED ORDER — IBUPROFEN 600 MG PO TABS: 600.0000 mg | ORAL_TABLET | Freq: Four times a day (QID) | ORAL | 0 refills | Status: DC | PRN

## 2017-10-31 MED ORDER — LIDOCAINE VISCOUS HCL 2 % MT SOLN: 10.0000 mL | Freq: Three times a day (TID) | OROMUCOSAL | 0 refills | Status: DC | PRN

## 2017-10-31 NOTE — ED Triage Notes (Signed)
Patient complains of sore throat x Sunday. Patient states that he has been having trouble swallowing.

## 2017-10-31 NOTE — Discharge Instructions (Addendum)
your rapid strep was negative today, so we have sent off a throat culture.  We will contact you and call in the appropriate antibiotics if your culture comes back positive for an infection requiring antibiotic treatment.  Give [place] a working phone number.  However, this seems to be viral.  1 gram of Tylenol and 600 mg ibuprofen together 3-4 times a day as needed for pain.  Make sure you drink plenty of extra fluids.  Some people find salt water gargles and  Traditional Medicinal's "Throat Coat" tea helpful. Take 5 mL of liquid Benadryl and 5 mL of Maalox. Mix it together, and then hold it in your mouth for as long as you can and then swallow. You may do this 4 times a day.  You can also use the viscous lidocaine.  Do not swallow this.  Go to www.goodrx.com to look up your medications. This will give you a list of where you can find your prescriptions at the most affordable prices. Or ask the pharmacist what the cash price is, or if they have any other discount programs available to help make your medication more affordable. This can be less expensive than what you would pay with insurance.

## 2017-10-31 NOTE — ED Provider Notes (Signed)
HPI  SUBJECTIVE:  Patient reports sore throat starting 5 days ago.  Sx worse with swallowing.  Sx better with ibuprofen 400 mg.  He has been taking a leftover prescription of amoxicillin 500 mg twice daily.  He is on day #3 of this.  States that it is not helping. States that he has felt feverish but has not checked his temperature   No neck stiffness  No Cough/URI sxs No Myalgias No Headache No Rash     No Recent known Strep Exposure No Abdominal Pain No reflux sxs No Allergy sxs  No Breathing difficulty, voice changes, sensation of throat swelling shut No Drooling No Trismus + abx in past month-amoxicillin for the past 3 days.  + antipyretic in past 4-6 hrs- IBU.   Past medical history of hypertension.  No history of frequent strep, GERD, allergies, mono, diabetes. ZOX:[id number], Redge Gainer, MD    Past Medical History:  Diagnosis Date  . Hypertension   . Lichen planus     Past Surgical History:  Procedure Laterality Date  . NO PAST SURGERIES      Family History  Problem Relation Age of Onset  . Cancer Father        colon  . Heart attack Father   . Hypertension Father     Social History   Tobacco Use  . Smoking status: Former Games developer  . Smokeless tobacco: Never Used  Substance Use Topics  . Alcohol use: No  . Drug use: No    No current facility-administered medications for this encounter.   Current Outpatient Medications:  .  benazepril (LOTENSIN) 40 MG tablet, Take 1 tablet (40 mg total) by mouth daily., Disp: 30 tablet, Rfl: 12 .  sildenafil (REVATIO) 20 MG tablet, Take 20 mg by mouth 3 (three) times daily., Disp: , Rfl:  .  traZODone (DESYREL) 100 MG tablet, Take 1 tablet (100 mg total) by mouth at bedtime., Disp: 30 tablet, Rfl: 12 .  amLODipine (NORVASC) 5 MG tablet, Take 1 tablet (5 mg total) by mouth daily., Disp: 30 tablet, Rfl: 12 .  ibuprofen (ADVIL,MOTRIN) 600 MG tablet, Take 1 tablet (600 mg total) by mouth every 6 (six) hours as needed., Disp:  30 tablet, Rfl: 0 .  lidocaine (XYLOCAINE) 2 % solution, Use as directed 10 mLs in the mouth or throat 3 (three) times daily as needed. Swish and spit. Do not swallow., Disp: 100 mL, Rfl: 0  No Known Allergies   ROS  As noted in HPI.   Physical Exam  BP (!) 149/92 (BP Location: Left Arm)   Pulse 96   Temp 98.5 F (36.9 C) (Oral)   Resp 18   Ht 5\' 11"  (1.803 m)   Wt 104.3 kg   SpO2 98%   BMI 32.08 kg/m   Constitutional: Well developed, well nourished, no acute distress Eyes:  EOMI, conjunctiva normal bilaterally HENT: Normocephalic, atraumatic,mucus membranes moist. + mild nasal congestion + erythematous oropharynx with multiple shallow white ulcers over the soft palate and uvula.  - enlarged tonsils - exudates. Uvula swollen, erythematous, but midline.  Respiratory: Normal inspiratory effort Cardiovascular: Normal rate, no murmurs, rubs, gallops GI: nondistended, nontender. No appreciable splenomegaly skin: No rash, skin intact Lymph: + Anterior cervical LN.  Musculoskeletal: no deformities Neurologic: Alert & oriented x 3, no focal neuro deficits Psychiatric: Speech and behavior appropriate.   ED Course   Medications - No data to display  Orders Placed This Encounter  Procedures  . Rapid Strep Screen ([facility] ONLY)    Standing Status:   Standing    Number of Occurrences:   1  . Culture, group A strep    Standing Status:   Standing    Number of Occurrences:   1    No results found for this or any previous visit (from the past 24 hour(s)). No results found.  ED Clinical Impression  Viral pharyngitis   ED Assessment/Plan   Rapid strep negative. Obtaining throat culture to guide antibiotic treatment. Discussed this with patient. We'll contact them if culture is positive, and will call in Appropriate antibiotics.  However this appears to be viral with the multiple ulcers.  Patient home with ibuprofen, Tylenol, Benadryl/Maalox mixture, viscous lidocaine.   Advised patient to discontinue the amoxicillin.. Patient to followup with PMD when necessary.    Discussed labs,  MDM, plan and followup with patient. Discussed sn/sx that should prompt return to the ED. patient agrees with plan.   Meds ordered this encounter  Medications  . ibuprofen (ADVIL,MOTRIN) 600 MG tablet    Sig: Take 1 tablet (600 mg total) by mouth every 6 (six) hours as needed.    Dispense:  30 tablet    Refill:  0  . lidocaine (XYLOCAINE) 2 % solution    Sig: Use as directed 10 mLs in the mouth or throat 3 (three) times daily as needed. Swish and spit. Do not swallow.    Dispense:  100 mL    Refill:  0     *This clinic note was created using Scientist, clinical (histocompatibility and immunogenetics). Therefore, there may be occasional mistakes despite careful proofreading.    Domenick Gong, MD 11/01/17 [phone]

## 2017-11-03 LAB — CULTURE, GROUP A STREP (THRC)

## 2018-01-06 DIAGNOSIS — J209 Acute bronchitis, unspecified: Secondary | ICD-10-CM | POA: Diagnosis not present

## 2018-01-06 DIAGNOSIS — J069 Acute upper respiratory infection, unspecified: Secondary | ICD-10-CM | POA: Diagnosis not present

## 2018-03-12 ENCOUNTER — Other Ambulatory Visit: Payer: Self-pay | Admitting: Family Medicine

## 2018-03-12 MED ORDER — TRAZODONE HCL 100 MG PO TABS: 100.0000 mg | ORAL_TABLET | Freq: Every day | ORAL | 2 refills | Status: DC

## 2018-03-12 MED ORDER — AMLODIPINE BESYLATE 5 MG PO TABS: 5.0000 mg | ORAL_TABLET | Freq: Every day | ORAL | 2 refills | Status: DC

## 2018-03-12 MED ORDER — BENAZEPRIL HCL 40 MG PO TABS
40.0000 mg | ORAL_TABLET | Freq: Every day | ORAL | 2 refills | Status: DC
Start: 2018-03-12 — End: 2018-07-29

## 2018-03-12 NOTE — Telephone Encounter (Signed)
Copied from CRM [id number]. Topic: Quick Communication - Rx Refill/Question >> Mar 12, 2018  1:27 PM Arlyss Gandy, NT wrote: Medication: traZODone (DESYREL) 100 MG tablet, benazepril (LOTENSIN) 40 MG tablet, amLODipine (NORVASC) 5 MG tablet  Has the patient contacted their pharmacy? Yes.   (Agent: If no, request that the patient contact the pharmacy for the refill.) (Agent: If yes, when and what did the pharmacy advise?)  Preferred Pharmacy (with phone number or street name): [facility] [place], [place] - [address] [phone] (Phone) [phone] (Fax)    Agent: Please be advised that RX refills may take up to 3 business days. We ask that you follow-up with your pharmacy.

## 2018-03-12 NOTE — Telephone Encounter (Signed)
apt 

## 2018-03-12 NOTE — Telephone Encounter (Signed)
Requested medication (s) are due for refill today: yes to all 3  Requested medication (s) are on the active medication list: yes  Last refill:  Trazodone: 03/25/17                   Benazepril: 03/25/17                   Amlodipine: 03/25/17  Future visit scheduled: no  Notes to clinic:  Called pt to make appointment. Pt stated that he still had 1 refill left when [facility] closed. Pt received a letter that his remaining prescriptions were sent to another pharmacy. Pt stated he called the pharmacy names in the letter and the prescriptions were not there.  Pt asked if NT could refill medications with the 1 remaining prescription. Informed pt that I unable to refill medications  without knowing where the prescriptions are. Asked pt if he could make an appointment because he has not been seen in 11 months. Pt stated that when he got his CPE he told Dr Dossie Arbour that he was there to get refills on his medication. Explained to pt that with the medications he is taking he needs to be seen every 6 months so his BP can be monitored and examined. He stated "That is ridiculous. I was there for a physical and because I mentioned the word blood pressure I was sent a bill and had to send another co pay. This makes me want to go somewhere else this." Pt stated "What are you going to do with my requests and you all don't care about my blood pressure." Informed pt that I was going to route back to the office to have the provider review. Pt frustrated and stated,"Maybe I'll call back." then hung up.  Routing request back to the office.   Requested Prescriptions  Pending Prescriptions Disp Refills   traZODone (DESYREL) 100 MG tablet 30 tablet 12    Sig: Take 1 tablet (100 mg total) by mouth at bedtime.     Psychiatry: Antidepressants - Serotonin Modulator Failed - 03/12/2018  2:04 PM      Failed - Valid encounter within last 6 months    Recent Outpatient Visits          11 months ago Essential hypertension   [facility] Steele Sizer, MD   2 years ago Upper respiratory infection   [facility] [place] Maser [place], [place]   2 years ago Essential hypertension   [facility] [place], Megan P, DO            benazepril (LOTENSIN) 40 MG tablet 30 tablet 12    Sig: Take 1 tablet (40 mg total) by mouth daily.     Cardiovascular:  ACE Inhibitors Failed - 03/12/2018  2:04 PM      Failed - Cr in normal range and within 180 days    Creatinine, Ser  Date Value Ref Range Status  03/25/2017 0.87 0.76 - 1.27 mg/dL Final         Failed - K in normal range and within 180 days    Potassium  Date Value Ref Range Status  03/25/2017 3.7 3.5 - 5.2 mmol/L Final         Failed - Last BP in normal range    BP Readings from Last 1 Encounters:  10/31/17 (!) 149/92         Failed - Valid encounter within last 6 months    Recent  Outpatient Visits          11 months ago Essential hypertension   [facility] Steele Sizer, MD   2 years ago Upper respiratory infection   [facility] Roosvelt Maser [place], [place]   2 years ago Essential hypertension   [facility] [place], [place], [place]             Passed - Patient is not pregnant     amLODipine (NORVASC) 5 MG tablet 30 tablet 12    Sig: Take 1 tablet (5 mg total) by mouth daily.     Cardiovascular:  Calcium Channel Blockers Failed - 03/12/2018  2:04 PM      Failed - Last BP in normal range    BP Readings from Last 1 Encounters:  10/31/17 (!) 149/92         Failed - Valid encounter within last 6 months    Recent Outpatient Visits          11 months ago Essential hypertension   [facility] Steele Sizer, MD   2 years ago Upper respiratory infection   [facility] [place], [place], [place]   2 years ago Essential hypertension   [facility] [place], [place], [place]

## 2018-03-13 ENCOUNTER — Encounter: Payer: Self-pay | Admitting: Family Medicine

## 2018-03-13 NOTE — Telephone Encounter (Signed)
Letter mailed

## 2018-04-22 DIAGNOSIS — L439 Lichen planus, unspecified: Secondary | ICD-10-CM | POA: Diagnosis not present

## 2018-06-26 DIAGNOSIS — Z1159 Encounter for screening for other viral diseases: Secondary | ICD-10-CM | POA: Diagnosis not present

## 2018-07-21 ENCOUNTER — Emergency Department
Admission: EM | Admit: 2018-07-21 | Discharge: 2018-07-21 | Disposition: A | Discharge status: Other Acute Inpatient Hospital | Payer: BLUE CROSS/BLUE SHIELD | Attending: Emergency Medicine | Admitting: Emergency Medicine

## 2018-07-21 ENCOUNTER — Ambulatory Visit: Payer: Self-pay

## 2018-07-21 ENCOUNTER — Emergency Department: Payer: BLUE CROSS/BLUE SHIELD

## 2018-07-21 ENCOUNTER — Inpatient Hospital Stay (HOSPITAL_COMMUNITY)
Admission: AD | Admit: 2018-07-21 | Discharge: 2018-07-29 | DRG: 177 | Disposition: A | Discharge status: Home / Self Care | Payer: BLUE CROSS/BLUE SHIELD | Source: Other Acute Inpatient Hospital | Attending: Internal Medicine | Admitting: Internal Medicine

## 2018-07-21 ENCOUNTER — Other Ambulatory Visit: Payer: Self-pay

## 2018-07-21 ENCOUNTER — Encounter (HOSPITAL_COMMUNITY): Payer: Self-pay | Admitting: Emergency Medicine

## 2018-07-21 ENCOUNTER — Encounter: Payer: Self-pay | Admitting: Emergency Medicine

## 2018-07-21 DIAGNOSIS — J8489 Other specified interstitial pulmonary diseases: Secondary | ICD-10-CM | POA: Diagnosis present

## 2018-07-21 DIAGNOSIS — I10 Essential (primary) hypertension: Secondary | ICD-10-CM | POA: Insufficient documentation

## 2018-07-21 DIAGNOSIS — Z79899 Other long term (current) drug therapy: Secondary | ICD-10-CM | POA: Insufficient documentation

## 2018-07-21 DIAGNOSIS — U071 COVID-19: Secondary | ICD-10-CM | POA: Diagnosis not present

## 2018-07-21 DIAGNOSIS — R945 Abnormal results of liver function studies: Secondary | ICD-10-CM

## 2018-07-21 DIAGNOSIS — I2694 Multiple subsegmental pulmonary emboli without acute cor pulmonale: Secondary | ICD-10-CM | POA: Diagnosis not present

## 2018-07-21 DIAGNOSIS — R0902 Hypoxemia: Secondary | ICD-10-CM | POA: Diagnosis not present

## 2018-07-21 DIAGNOSIS — R0602 Shortness of breath: Secondary | ICD-10-CM | POA: Diagnosis not present

## 2018-07-21 DIAGNOSIS — D696 Thrombocytopenia, unspecified: Secondary | ICD-10-CM | POA: Diagnosis not present

## 2018-07-21 DIAGNOSIS — J9601 Acute respiratory failure with hypoxia: Secondary | ICD-10-CM | POA: Diagnosis not present

## 2018-07-21 DIAGNOSIS — Z800 Family history of malignant neoplasm of digestive organs: Secondary | ICD-10-CM

## 2018-07-21 DIAGNOSIS — Z6834 Body mass index (BMI) 34.0-34.9, adult: Secondary | ICD-10-CM | POA: Diagnosis not present

## 2018-07-21 DIAGNOSIS — J984 Other disorders of lung: Secondary | ICD-10-CM | POA: Diagnosis not present

## 2018-07-21 DIAGNOSIS — R509 Fever, unspecified: Secondary | ICD-10-CM | POA: Insufficient documentation

## 2018-07-21 DIAGNOSIS — J1289 Other viral pneumonia: Secondary | ICD-10-CM | POA: Diagnosis present

## 2018-07-21 DIAGNOSIS — Z8249 Family history of ischemic heart disease and other diseases of the circulatory system: Secondary | ICD-10-CM

## 2018-07-21 DIAGNOSIS — R748 Abnormal levels of other serum enzymes: Secondary | ICD-10-CM | POA: Diagnosis present

## 2018-07-21 DIAGNOSIS — J069 Acute upper respiratory infection, unspecified: Secondary | ICD-10-CM | POA: Diagnosis not present

## 2018-07-21 DIAGNOSIS — L439 Lichen planus, unspecified: Secondary | ICD-10-CM | POA: Diagnosis not present

## 2018-07-21 DIAGNOSIS — J9600 Acute respiratory failure, unspecified whether with hypoxia or hypercapnia: Secondary | ICD-10-CM | POA: Diagnosis not present

## 2018-07-21 DIAGNOSIS — E669 Obesity, unspecified: Secondary | ICD-10-CM | POA: Diagnosis not present

## 2018-07-21 DIAGNOSIS — G4700 Insomnia, unspecified: Secondary | ICD-10-CM | POA: Diagnosis present

## 2018-07-21 DIAGNOSIS — E876 Hypokalemia: Secondary | ICD-10-CM | POA: Diagnosis present

## 2018-07-21 DIAGNOSIS — D72810 Lymphocytopenia: Secondary | ICD-10-CM | POA: Diagnosis present

## 2018-07-21 DIAGNOSIS — Z87891 Personal history of nicotine dependence: Secondary | ICD-10-CM | POA: Insufficient documentation

## 2018-07-21 DIAGNOSIS — R7989 Other specified abnormal findings of blood chemistry: Secondary | ICD-10-CM | POA: Diagnosis present

## 2018-07-21 DIAGNOSIS — R739 Hyperglycemia, unspecified: Secondary | ICD-10-CM | POA: Diagnosis present

## 2018-07-21 DIAGNOSIS — N179 Acute kidney failure, unspecified: Secondary | ICD-10-CM | POA: Diagnosis present

## 2018-07-21 DIAGNOSIS — R000 Tachycardia, unspecified: Secondary | ICD-10-CM | POA: Diagnosis not present

## 2018-07-21 DIAGNOSIS — R0689 Other abnormalities of breathing: Secondary | ICD-10-CM | POA: Diagnosis not present

## 2018-07-21 LAB — COMPREHENSIVE METABOLIC PANEL
ALT: 54 U/L — ABNORMAL HIGH (ref 0–44)
AST: 138 U/L — ABNORMAL HIGH (ref 15–41)
Albumin: 3.3 g/dL — ABNORMAL LOW (ref 3.5–5.0)
Alkaline Phosphatase: 52 U/L (ref 38–126)
Anion gap: 12 (ref 5–15)
BUN: 27 mg/dL — ABNORMAL HIGH (ref 6–20)
CO2: 28 mmol/L (ref 22–32)
Calcium: 8.3 mg/dL — ABNORMAL LOW (ref 8.9–10.3)
Chloride: 91 mmol/L — ABNORMAL LOW (ref 98–111)
Creatinine, Ser: 1.45 mg/dL — ABNORMAL HIGH (ref 0.61–1.24)
GFR calc Af Amer: >60 mL/min (ref >60)
GFR calc non Af Amer: 52 mL/min — ABNORMAL LOW (ref >60)
Glucose, Bld: 170 mg/dL — ABNORMAL HIGH (ref 70–99)
Potassium: 3.3 mmol/L — ABNORMAL LOW (ref 3.5–5.1)
Sodium: 131 mmol/L — ABNORMAL LOW (ref 135–145)
Total Bilirubin: 1.3 mg/dL — ABNORMAL HIGH (ref 0.3–1.2)
Total Protein: 6.8 g/dL (ref 6.5–8.1)

## 2018-07-21 LAB — CBC WITH DIFFERENTIAL/PLATELET
Abs Immature Granulocytes: 0.03 10*3/uL (ref 0.00–0.07)
Basophils Absolute: 0 10*3/uL (ref 0.0–0.1)
Basophils Relative: 0 %
Eosinophils Absolute: 0 10*3/uL (ref 0.0–0.5)
Eosinophils Relative: 0 %
HCT: 38.9 % — ABNORMAL LOW (ref 39.0–52.0)
Hemoglobin: 13.9 g/dL (ref 13.0–17.0)
Immature Granulocytes: 1 %
Lymphocytes Relative: 10 %
Lymphs Abs: 0.5 10*3/uL — ABNORMAL LOW (ref 0.7–4.0)
MCH: 30.1 pg (ref 26.0–34.0)
MCHC: 35.7 g/dL (ref 30.0–36.0)
MCV: 84.2 fL (ref 80.0–100.0)
Monocytes Absolute: 0.4 10*3/uL (ref 0.1–1.0)
Monocytes Relative: 8 %
Neutro Abs: 4.3 10*3/uL (ref 1.7–7.7)
Neutrophils Relative %: 81 %
Platelets: 144 10*3/uL — ABNORMAL LOW (ref 150–400)
RBC: 4.62 MIL/uL (ref 4.22–5.81)
RDW: 13 % (ref 11.5–15.5)
WBC: 5.3 10*3/uL (ref 4.0–10.5)
nRBC: 0 % (ref 0.0–0.2)

## 2018-07-21 LAB — PROTIME-INR
INR: 1 (ref 0.8–1.2)
Prothrombin Time: 13.4 seconds (ref 11.4–15.2)

## 2018-07-21 LAB — SARS CORONAVIRUS 2 BY RT PCR (HOSPITAL ORDER, PERFORMED IN ~~LOC~~ HOSPITAL LAB): SARS Coronavirus 2: POSITIVE — AB

## 2018-07-21 LAB — PROCALCITONIN: Procalcitonin: 0.2 ng/mL

## 2018-07-21 LAB — LACTIC ACID, PLASMA: Lactic Acid, Venous: 2.2 mmol/L (ref 0.5–1.9)

## 2018-07-21 LAB — GLUCOSE, CAPILLARY: Glucose-Capillary: 146 mg/dL — ABNORMAL HIGH (ref 70–99)

## 2018-07-21 LAB — BRAIN NATRIURETIC PEPTIDE: B Natriuretic Peptide: 27 pg/mL (ref 0.0–100.0)

## 2018-07-21 LAB — LIPASE, BLOOD: Lipase: 74 U/L — ABNORMAL HIGH (ref 11–51)

## 2018-07-21 MED ORDER — SODIUM CHLORIDE 0.9 % IV BOLUS
500.0000 mL | Freq: Once | INTRAVENOUS | Status: AC
  Administered 2018-07-21: 500 mL via INTRAVENOUS

## 2018-07-21 MED ORDER — ENOXAPARIN SODIUM 40 MG/0.4ML ~~LOC~~ SOLN
40.0000 mg | Freq: Two times a day (BID) | SUBCUTANEOUS | Status: DC
  Administered 2018-07-21 – 2018-07-28 (×15): 40 mg via SUBCUTANEOUS
  Filled 2018-07-21 (×14): qty 0.4

## 2018-07-21 MED ORDER — SODIUM CHLORIDE 0.9 % IV SOLN
200.0000 mg | Freq: Once | INTRAVENOUS | Status: AC
  Administered 2018-07-21: 200 mg via INTRAVENOUS
  Filled 2018-07-21: qty 40

## 2018-07-21 MED ORDER — ACETAMINOPHEN 500 MG PO TABS
1000.0000 mg | ORAL_TABLET | ORAL | Status: AC
  Administered 2018-07-21: 1000 mg via ORAL
  Filled 2018-07-21: qty 2

## 2018-07-21 MED ORDER — SODIUM CHLORIDE 0.9 % IV SOLN
100.0000 mg | INTRAVENOUS | Status: AC
  Administered 2018-07-22 – 2018-07-25 (×4): 100 mg via INTRAVENOUS
  Filled 2018-07-21 (×4): qty 20

## 2018-07-21 MED ORDER — HYDROCOD POLST-CPM POLST ER 10-8 MG/5ML PO SUER
5.0000 mL | Freq: Two times a day (BID) | ORAL | Status: DC | PRN
  Administered 2018-07-22 – 2018-07-24 (×3): 5 mL via ORAL
  Filled 2018-07-21 (×3): qty 5

## 2018-07-21 MED ORDER — SODIUM CHLORIDE 0.9 % IV SOLN
INTRAVENOUS | Status: DC | PRN
  Administered 2018-07-21: 250 mL via INTRAVENOUS

## 2018-07-21 MED ORDER — SODIUM CHLORIDE 0.9% FLUSH
3.0000 mL | Freq: Two times a day (BID) | INTRAVENOUS | Status: DC
  Administered 2018-07-21 – 2018-07-29 (×15): 3 mL via INTRAVENOUS

## 2018-07-21 MED ORDER — INSULIN ASPART 100 UNIT/ML ~~LOC~~ SOLN
0.0000 [IU] | Freq: Three times a day (TID) | SUBCUTANEOUS | Status: DC
  Administered 2018-07-22 (×3): 2 [IU] via SUBCUTANEOUS
  Administered 2018-07-23: 13:00:00 5 [IU] via SUBCUTANEOUS
  Administered 2018-07-23: 1 [IU] via SUBCUTANEOUS
  Administered 2018-07-23: 3 [IU] via SUBCUTANEOUS
  Administered 2018-07-24: 14:00:00 5 [IU] via SUBCUTANEOUS
  Administered 2018-07-24: 18:00:00 9 [IU] via SUBCUTANEOUS
  Administered 2018-07-24: 5 [IU] via SUBCUTANEOUS
  Administered 2018-07-25 (×2): 2 [IU] via SUBCUTANEOUS
  Administered 2018-07-25: 13:00:00 5 [IU] via SUBCUTANEOUS
  Administered 2018-07-26 – 2018-07-28 (×6): 2 [IU] via SUBCUTANEOUS
  Administered 2018-07-28 – 2018-07-29 (×2): 3 [IU] via SUBCUTANEOUS
  Administered 2018-07-29: 1 [IU] via SUBCUTANEOUS

## 2018-07-21 MED ORDER — POTASSIUM CHLORIDE CRYS ER 20 MEQ PO TBCR
40.0000 meq | EXTENDED_RELEASE_TABLET | Freq: Once | ORAL | Status: AC
  Administered 2018-07-21: 40 meq via ORAL
  Filled 2018-07-21: qty 2

## 2018-07-21 MED ORDER — METHYLPREDNISOLONE SODIUM SUCC 40 MG IJ SOLR
40.0000 mg | Freq: Three times a day (TID) | INTRAMUSCULAR | Status: AC
  Administered 2018-07-21 – 2018-07-24 (×9): 40 mg via INTRAVENOUS
  Filled 2018-07-21 (×9): qty 1

## 2018-07-21 MED ORDER — ONDANSETRON HCL 4 MG/2ML IJ SOLN: 4.0000 mg | Freq: Four times a day (QID) | INTRAMUSCULAR | Status: DC | PRN

## 2018-07-21 MED ORDER — ACETAMINOPHEN 325 MG PO TABS
650.0000 mg | ORAL_TABLET | Freq: Four times a day (QID) | ORAL | Status: DC | PRN
  Administered 2018-07-21: 650 mg via ORAL
  Filled 2018-07-21: qty 2

## 2018-07-21 MED ORDER — INSULIN ASPART 100 UNIT/ML ~~LOC~~ SOLN
0.0000 [IU] | Freq: Every day | SUBCUTANEOUS | Status: DC
  Administered 2018-07-23 – 2018-07-24 (×2): 5 [IU] via SUBCUTANEOUS

## 2018-07-21 MED ORDER — GUAIFENESIN-DM 100-10 MG/5ML PO SYRP
10.0000 mL | ORAL_SOLUTION | ORAL | Status: DC | PRN
  Administered 2018-07-21 – 2018-07-28 (×4): 10 mL via ORAL
  Filled 2018-07-21 (×4): qty 10

## 2018-07-21 MED ORDER — ONDANSETRON HCL 4 MG PO TABS: 4.0000 mg | ORAL_TABLET | Freq: Four times a day (QID) | ORAL | Status: DC | PRN

## 2018-07-21 NOTE — H&P (Signed)
History and Physical   Jason Kelchony B Ainley ZOX:[id number] DOB: 1960-03-25 DOA: 07/21/2018  Referring MD/NP/PA: EDP PCP: Steele Sizerrissman, Mark A, MD  Patient coming from: Home  Chief Complaint: Shortness of breath  HPI: Jason Huerta is a 58 y.o. 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 He began having symptoms including cough, fevers, malaise, decreased appetite about 10 days ago, worsening over past 4-5 days associated with increased dyspnea. He'd tested positive for coronavirus previously and this was confirmed at ED. On EMS arrival, SpO2 was in 70%'s, improved on NRB with ongoing tachypnea but improved respiratory effort. [facility].  Review of Systems: +Fevers, chills, poor per oral intake, muscle aches without joint aches or weakness/numbess, and per HPI. All others reviewed and are negative.   Past Medical History:  Diagnosis Date  . Hypertension   . Lichen planus    Past Surgical History:  Procedure Laterality Date  . NO PAST SURGERIES     - Former smoker, very rare EtOH. No hx lung, liver, heart disease.  reports that he has quit smoking. He has never used smokeless tobacco. He reports current alcohol use. He reports that he does not use drugs. No Known Allergies Family History  Problem Relation Age of Onset  . Cancer Father        colon  . Heart attack Father   . Hypertension Father    - Family history otherwise reviewed and not pertinent.  Prior to Admission medications   Medication Sig Start Date End Date Taking? Authorizing Provider  amLODipine (NORVASC) 5 MG tablet Take 1 tablet (5 mg total) by mouth daily. 03/12/18   Steele Sizerrissman, Mark A, MD  benazepril (LOTENSIN) 40 MG tablet Take 1 tablet (40 mg total) by mouth daily. 03/12/18   Steele Sizerrissman, Mark A, MD  traZODone (DESYREL) 100 MG tablet Take 1 tablet (100 mg total) by  mouth at bedtime. 03/12/18   Steele Sizerrissman, Mark A, MD    Physical Exam: BP 123/74 (BP Location: Left Arm)   Pulse (!) 103   Temp (!) 102.3 F (39.1 C) (Oral)   Resp (!) 26   Ht 5\' 11"  (1.803 m)   Wt 113.4 kg   SpO2 93%   BMI 34.87 kg/m   Constitutional: 58 y.o. male in no distress, calm demeanor Eyes: Lids and conjunctivae normal, PERRL ENMT: Mucous membranes are moist. Posterior pharynx clear of any exudate or lesions. Fair dentition.  Neck: normal, supple, no masses, no thyromegaly Respiratory: Non-labored tachypnea without accessory muscle use. End-expiratory wheezes bilaterally without crackles. Cardiovascular: Regular rate and rhythm, no murmurs, rubs, or gallops. No carotid bruits. No JVD. No LE edema. 2+ pedal pulses. Abdomen: Normoactive bowel sounds. No tenderness, non-distended, and no masses palpated. No hepatosplenomegaly. GU: No indwelling catheter Musculoskeletal: No clubbing / cyanosis. No joint deformity upper and lower extremities. Good ROM, no contractures. Normal muscle tone.  Skin: Warm, dry. No rashes, wounds, or ulcers. No significant lesions noted.  Neurologic: CN II-XII grossly intact. Speech normal. No focal deficits in motor strength or sensation in all extremities.  Psychiatric: Alert and oriented x3. Normal judgment and insight. Mood euthymic with anxious affect.   Labs on Admission: I have personally reviewed following labs and imaging studies  CBC: Recent Labs  Lab 07/21/18 1357  WBC 5.3  NEUTROABS 4.3  HGB 13.9  HCT 38.9*  MCV  84.2  PLT 144*   Basic Metabolic Panel: Recent Labs  Lab 07/21/18 1357  NA 131*  K 3.3*  CL 91*  CO2 28  GLUCOSE 170*  BUN 27*  CREATININE 1.45*  CALCIUM 8.3*   GFR: Estimated Creatinine Clearance: 70.2 mL/min (A) (by C-G formula based on SCr of 1.45 mg/dL (H)). Liver Function Tests: Recent Labs  Lab 07/21/18 1357  AST 138*  ALT 54*  ALKPHOS 52  BILITOT 1.3*  PROT 6.8  ALBUMIN 3.3*   Recent Labs  Lab  07/21/18 1357  LIPASE 74*   No results for input(s): AMMONIA in the last 168 hours. Coagulation Profile: Recent Labs  Lab 07/21/18 1357  INR 1.0   Urine analysis:    Component Value Date/Time   APPEARANCEUR Clear 03/25/2017 1314   GLUCOSEU Negative 03/25/2017 1314   BILIRUBINUR Negative 03/25/2017 1314   PROTEINUR Negative 03/25/2017 1314   NITRITE Negative 03/25/2017 1314   LEUKOCYTESUR Negative 03/25/2017 1314    Recent Results (from the past 240 hour(s))  SARS Coronavirus 2 (CEPHEID- Performed in [facility] hospital lab), Hosp Order     Status: Abnormal   Collection Time: 07/21/18  1:56 PM  Result Value Ref Range Status   SARS Coronavirus 2 POSITIVE (A) NEGATIVE Final    Comment: RESULT CALLED TO, READ BACK BY AND VERIFIED WITH: STEPHEN JONES  ON 07/21/2018 BY FMW (NOTE) If result is NEGATIVE SARS-CoV-2 target nucleic acids are NOT DETECTED. The SARS-CoV-2 RNA is generally detectable in upper and lower  respiratory specimens during the acute phase of infection. The lowest  concentration of SARS-CoV-2 viral copies this assay can detect is 250  copies / mL. A negative result does not preclude SARS-CoV-2 infection  and should not be used as the sole basis for treatment or other  patient management decisions.  A negative result may occur with  improper specimen collection / handling, submission of specimen other  than nasopharyngeal swab, presence of viral mutation(s) within the  areas targeted by this assay, and inadequate number of viral copies  (<250 copies / mL). A negative result must be combined with clinical  observations, patient history, and epidemiological information. If result is POSITIVE SARS-CoV-2 target nucleic acids are DETECTE D. The SARS-CoV-2 RNA is generally detectable in upper and lower  respiratory specimens during the acute phase of infection.  Positive  results are indicative of active infection with SARS-CoV-2.  Clinical  correlation with  patient history and other diagnostic information is  necessary to determine patient infection status.  Positive results do  not rule out bacterial infection or co-infection with other viruses. If result is PRESUMPTIVE POSTIVE SARS-CoV-2 nucleic acids MAY BE PRESENT.   A presumptive positive result was obtained on the submitted specimen  and confirmed on repeat testing.  While 2019 novel coronavirus  (SARS-CoV-2) nucleic acids may be present in the submitted sample  additional confirmatory testing may be necessary for epidemiological  and / or clinical management purposes  to differentiate between  SARS-CoV-2 and other Sarbecovirus currently known to infect humans.  If clinically indicated additional testing with an alternate test  methodology (LAB745 3) is advised. The SARS-CoV-2 RNA is generally  detectable in upper and lower respiratory specimens during the acute  phase of infection. The expected result is Negative. Fact Sheet for Patients:  BoilerBrush.com.cy Fact Sheet for Healthcare Providers: https://pope.com/ This test is not yet approved or cleared by the [place] FDA and has been authorized for detection and/or diagnosis of SARS-CoV-2 by  FDA under an Emergency Use Authorization (EUA).  This EUA will remain in effect (meaning this test can be used) for the duration of the COVID-19 declaration under Section 564(b)(1) of the Act, 21 U.S.C. section 360bbb-3(b)(1), unless the authorization is terminated or revoked sooner. Performed at [facility], [address], [place], [place] [address]      Radiological Exams on Admission: Dg Chest Portable 1 View  Result Date: 07/21/2018 CLINICAL DATA:  Shortness of breath. EXAM: PORTABLE CHEST 1 VIEW COMPARISON:  No recent. FINDINGS: Mediastinum and hilar structures normal. Heart size normal. Diffuse bilateral pulmonary interstitial prominence. An active interstitial process  including pneumonitis and or interstitial edema could present in this fashion. Tiny left pleural effusion cannot be excluded. No pneumothorax. Degenerative change thoracic spine. IMPRESSION: Diffuse bilateral from interstitial prominence. An active interstitial process including pneumonitis and/or interstitial edema could present this fashion. Tiny left pleural effusion cannot be excluded. Electronically Signed   By: Maisie Fus  Register   On: 07/21/2018 14:04    EKG: Independently reviewed. Sinus tachycardia with ventricular rate 116, QTc is wnl.   Assessment/Plan Principal Problem:   Acute respiratory disease due to COVID-19 virus Active Problems:   Hypertension   Insomnia   LFT elevation   Hyperglycemia   Hypokalemia   Thrombocytopenia (HCC)   AKI (acute kidney injury) (HCC)   Acute hypoxic respiratory failure due to covid-19 infection: Felt to be at risk of progression to ARDS based on current assessment. Comorbidities including HTN, obesity. Lymphopenia, thrombocytopenia also noted. CXR demonstrating bilateral interstitial pneumonitis, felt less likely to be cardiogenic edema, especially with normal BNP. - Continue supplemental oxygen, check ABG now, CXR repeat in AM. - Continue airborne, contact precautions. PPE including surgical gown, gloves, face shield, cap, shoe covers, and N-95 used during this encounter in a negative pressure room.  - Check daily labs: CBC w/diff, CMP, d-dimer, fibrinogen, ferritin, LDH, CRP - Enoxaparin intermediate dose.  - Maintain euvolemia/conservative fluid strategy - Avoid NSAIDs - Recommend proning and aggressive use of incentive spirometry. Discussed with patient at length. - Start steroids for wheezing as below - Start remdesivir. Discussed risks and benefits with patient at admission. Will monitor LFTs, plan 5-day course for now. - Goals of care were discussed, full scope, lost best friend after prolonged ventilator-dependent respiratory failure last  year. Prognosis is guarded.  HTN:  - Norvasc , benazepril  on hold due to normotensive currently.  LFT elevation: AST >> ALT still less than 5x ULN. Baseline appears to have been normal. With elevated lipase and bilirubin. No abd pain, abd exam benign. - RUQ U/S - Hepatitis B SAg checking  Hyperglycemia:  - Check HbA1c - Provide SSI to maintain tight control while admitted  Hypokalemia:  - Supplement and recheck with Mg in AM  Thrombocytopenia: Mild, possibly due to viral infection and/or liver disease.  - Monitor, ok to use pharmacologic DVT ppx.   AKI: Cr 1.45 from previous value of 0.87.  - Likely due to poor per oral intake. To maintain restrictive fluid strategy, will not give IVF, but monitor BMP, UOP, and avoid nephrotoxins.    Insomnia:  - Continue home trazodone   History of lichen planus: Quiescent, has tolerated prednisone in the past.  - Monitor  DVT prophylaxis: Lovenox  [place] BID  Code Status: Full, confirmed at admission  Family Communication: None at bedside Disposition Plan: Uncertain Consults called: None  Admission status: Inpatient    Tyrone Nine, MD [facility] [email or web address] Password [email or web address]  07/22/2018, 1:16 AM

## 2018-07-21 NOTE — ED Notes (Signed)
Date and time results received: 07/21/18 3:45 PM (use smartphrase ".now" to insert current time)  Test: COVID Critical Value: Positive  Name of Provider Notified: Dr. Fanny Bien  Orders Received? Or Actions Taken?: No new orders at this time.

## 2018-07-21 NOTE — ED Notes (Signed)
Date and time results received: 07/21/18 2:58 PM (use smartphrase ".now" to insert current time)  Test: Lactic Acid Critical Value: 2.2  Name of Provider Notified: Dr. Fanny Bien  Orders Received? Or Actions Taken?: No new orders at this time.

## 2018-07-21 NOTE — Telephone Encounter (Signed)
Daughter called to report pt. Was diagnosed with COVID 19 last Tues.  Stated he has been sick for approx. 10 days, and is not improving.  Reported fevers of 102-103, and not staying down more than on hour.  Reported onset of shortness of breath since yesterday.  Reported she noted wheezing when speaking with him last night.  Reported cough with production of orange colored mucus.  Daughter not with pt. At this time.  Attempted to call pt.  Left vm. That daughter is concerned about worsening symptoms, and nurse was calling to get update on his symptoms this AM.  ret'd call to daughter, Aldona Bar.  She will call patient at this time, and nurse will follow up in 10 min. To get status report.    Reason for Disposition . MODERATE difficulty breathing (e.g., speaks in phrases, SOB even at rest, pulse 100-120)  Answer Assessment - Initial Assessment Questions 1. COVID-19 DIAGNOSIS: "Who made your Coronavirus (COVID-19) diagnosis?" "Was it confirmed by a positive lab test?" If not diagnosed by a HCP, ask "Are there lots of cases (community spread) where you live?" (See public health department website, if unsure)   * MAJOR community spread: high number of cases; numbers of cases are increasing; many people hospitalized.   * MINOR community spread: low number of cases; not increasing; few or no people hospitalized     Present in community 2. ONSET: "When did the COVID-19 symptoms start?"      About 10 days ago 3. WORST SYMPTOM: "What is your worst symptom?" (e.g., cough, fever, shortness of breath, muscle aches)     Shortness of breath 4. COUGH: "Do you have a cough?" If so, ask: "How bad is the cough?"      Coughing orange colored mucus 5. FEVER: "Do you have a fever?" If so, ask: "What is your temperature, how was it measured, and when did it start?"     Fevers are running in the 102 range 6. RESPIRATORY STATUS: "Describe your breathing?" (e.g., shortness of breath, wheezing, unable to speak)   Shortness of breath at rest  7. BETTER-SAME-WORSE: "Are you getting better, staying the same or getting worse compared to yesterday?"  If getting worse, ask, "In what way?"    Not getting better 8. HIGH RISK DISEASE: "Do you have any chronic medical problems?" (e.g., asthma, heart or lung disease, weak immune system, etc.)    HTN, overweight  9. PREGNANCY: "Is there any chance you are pregnant?" "When was your last menstrual period?"     n/a 10. OTHER SYMPTOMS: "Do you have any other symptoms?"  (e.g., runny nose, headache, sore throat, loss of smell)       *No Answer*  Protocols used: CORONAVIRUS (COVID-19) DIAGNOSED OR SUSPECTED-A-AH

## 2018-07-21 NOTE — ED Notes (Signed)
[facility]  CALLED  FOR  TRANSFER 

## 2018-07-21 NOTE — ED Provider Notes (Signed)
-----------------------------------------   3:41 PM on 07/21/2018 -----------------------------------------  Patient care assumed from Dr. Fanny Bien.  Patient's COVID test is positive.  Patient has an oxygen requirement currently on a nonrebreather mask satting in the upper 90s.  Patient will be transferred to [facility].  Currently awaiting callback from [facility] for transfer.     Minna Antis, MD 07/21/18 2238

## 2018-07-21 NOTE — ED Triage Notes (Signed)
Pt to ED via EMS from home c/o SOB that has been worsening x5 days.  Pt was tested and COVID (+) 11 days ago and has been self quarantining at home since then.  EMS found pt to be 75/80% RA on scene, placed on 15L NRB and up to 86-90%.  Pt presents A&Ox4, speaking in complete and coherent sentences, MD at bedside, in NAD at this time.  Denies pain.

## 2018-07-21 NOTE — Telephone Encounter (Signed)
Able to call and speak with pt.  Reported fever of 102.5 @ approx 8:10 AM; took Tylenol at that time.  Confirmed that he has not been able to keep fever down with Tylenol.  C/o shortness of breath at this time.  C/o chest tightness.  Advised pt. To go to ER; recommended that someone else drive him there.  Stated he did not want to expose anyone to this virus.  Advised to call EMS.  Pt. Agreed to call EMS.  Reported he planned to shower and to find someone to care for his dog, and then would call EMS.  Notified pt's. Daughter of recommendation.  Verb. Understanding.  she is helping to make arrangements for his dog to be cared for.

## 2018-07-21 NOTE — ED Provider Notes (Addendum)
[facility] Emergency Department Provider Note   ____________________________________________   First MD Initiated Contact with Patient 07/21/18 1334     (approximate)  I have reviewed the triage vital signs and the nursing notes.   HISTORY  Chief Complaint Shortness of Breath    HPI Jason Huerta is a 58 y.o. male history of hypertension and diagnosis of coronavirus about 10 days ago  Patient had a fever, was checked and noted to have positive COVID test.  He reports over the last day started have increasing shortness of breath.  He called, [facility] health department referred him to EMS.  EMS arrival patient saturations were about the 70s on room air.  Shortness of breath.  Placed on nonrebreather resultant oxygen saturations have now improved to the low 90s.  Reports he does feel better.  He continues to have some shortness of breath but is feeling better on oxygen.  No nausea vomiting.  No chest pain.  Just reports fevers some body aches and feeling short of breath.   No pain.  Takes medication for hypertension.  Past Medical History:  Diagnosis Date   Hypertension    Lichen planus     Patient Active Problem List   Diagnosis Date Noted   ED (erectile dysfunction) 03/25/2017   Insomnia 08/17/2015   Hypertension     Past Surgical History:  Procedure Laterality Date   NO PAST SURGERIES      Prior to Admission medications   Medication Sig Start Date End Date Taking? Authorizing Provider  amLODipine (NORVASC) 5 MG tablet Take 1 tablet (5 mg total) by mouth daily. 03/12/18  Yes Crissman, Redge Gainer, MD  benazepril (LOTENSIN) 40 MG tablet Take 1 tablet (40 mg total) by mouth daily. 03/12/18  Yes Crissman, Redge Gainer, MD  traZODone (DESYREL) 100 MG tablet Take 1 tablet (100 mg total) by mouth at bedtime. 03/12/18  Yes Steele Sizer, MD    Allergies Patient has no known allergies.  Family History  Problem Relation Age of Onset   Cancer Father          colon   Heart attack Father    Hypertension Father     Social History Social History   Tobacco Use   Smoking status: Former Smoker   Smokeless tobacco: Never Used  Substance Use Topics   Alcohol use: Yes    Comment: occ.   Drug use: No    Review of Systems Constitutional: There is an chills and some fatigue Eyes: No visual changes. ENT: No sore throat. Cardiovascular: Denies chest pain. Respiratory: See HPI Gastrointestinal: No abdominal pain.   Genitourinary: Negative for dysuria. Musculoskeletal: Negative for back pain.  No leg swelling. Skin: Negative for rash. Neurological: Negative for headaches or focal weakness    ____________________________________________   PHYSICAL EXAM:  VITAL SIGNS: ED Triage Vitals  Enc Vitals Group     BP 07/21/18 1337 113/73     Pulse Rate 07/21/18 1337 (!) 117     Resp 07/21/18 1337 (!) 36     Temp 07/21/18 1337 (!) 100.7 F (38.2 C)     Temp Source 07/21/18 1337 Oral     SpO2 07/21/18 1337 98 %     Weight 07/21/18 1338 250 lb (113.4 kg)     Height 07/21/18 1338  (1.803 m)     Head Circumference --      Peak Flow --      Pain Score 07/21/18 1338 0  Pain Loc --      Pain Edu? --      Excl. in GC? --     Constitutional: Alert and oriented.  Mild to moderately ill-appearing, sitting upright on nonrebreather. Eyes: Conjunctivae are normal. Head: Atraumatic. Nose: No congestion/rhinnorhea. Mouth/Throat: Mucous membranes are moist. Neck: No stridor.  Cardiovascular: Just minimally tachycardic rate, regular rhythm. Grossly normal heart sounds.  Good peripheral circulation. Respiratory: Mild tachypnea.  Slight accessory muscle use.  Speaks in phrases.  Oxygen saturation normal on nonrebreather.  He is not in respiratory extremis Gastrointestinal: Soft and nontender. No distention. Musculoskeletal: No lower extremity tenderness nor edema. Neurologic:  Normal speech and language. No gross focal neurologic  deficits are appreciated.  Skin:  Skin is warm, dry and intact. No rash noted. Psychiatric: Mood and affect are normal. Speech and behavior are normal.  ____________________________________________   LABS (all labs ordered are listed, but only abnormal results are displayed)  Labs Reviewed  SARS CORONAVIRUS 2 (HOSPITAL ORDER, PERFORMED IN [facility]) - Abnormal; Notable for the following components:      Result Value   SARS Coronavirus 2 POSITIVE (*)    All other components within normal limits  LACTIC ACID, PLASMA - Abnormal; Notable for the following components:   Lactic Acid, Venous 2.2 (*)    All other components within normal limits  COMPREHENSIVE METABOLIC PANEL - Abnormal; Notable for the following components:   Sodium 131 (*)    Potassium 3.3 (*)    Chloride 91 (*)    Glucose, Bld 170 (*)    BUN 27 (*)    Creatinine, Ser 1.45 (*)    Calcium 8.3 (*)    Albumin 3.3 (*)    AST 138 (*)    ALT 54 (*)    Total Bilirubin 1.3 (*)    GFR calc non Af Amer 52 (*)    All other components within normal limits  LIPASE, BLOOD - Abnormal; Notable for the following components:   Lipase 74 (*)    All other components within normal limits  CBC WITH DIFFERENTIAL/PLATELET - Abnormal; Notable for the following components:   HCT 38.9 (*)    Platelets 144 (*)    Lymphs Abs 0.5 (*)    All other components within normal limits  CULTURE, BLOOD (ROUTINE X 2)  CULTURE, BLOOD (ROUTINE X 2)  PROTIME-INR  PROCALCITONIN  BRAIN NATRIURETIC PEPTIDE   ____________________________________________  EKG  Reviewed entered by me at 1345 Heart rate 115 QRS 90 QTc 440 Sinus tachycardia, no evidence acute ischemia ____________________________________________  RADIOLOGY  Dg Chest Portable 1 View  Result Date: 07/21/2018 CLINICAL DATA:  Shortness of breath. EXAM: PORTABLE CHEST 1 VIEW COMPARISON:  No recent. FINDINGS: Mediastinum and hilar structures normal. Heart size normal.  Diffuse bilateral pulmonary interstitial prominence. An active interstitial process including pneumonitis and or interstitial edema could present in this fashion. Tiny left pleural effusion cannot be excluded. No pneumothorax. Degenerative change thoracic spine. IMPRESSION: Diffuse bilateral from interstitial prominence. An active interstitial process including pneumonitis and/or interstitial edema could present this fashion. Tiny left pleural effusion cannot be excluded. Electronically Signed   By: Maisie Fushomas  Register   On: 07/21/2018 14:04    X-ray reviewed concerning for diffuse interstitial prominence. ____________________________________________   PROCEDURES  Procedure(s) performed: None  Procedures  Critical Care performed: Yes, see critical care note(s)  Patient required oxygenation by nonrebreather.  High suspicion for acute COVID increased work of breathing.  CRITICAL CARE Performed by: Sharyn CreamerMark Jeliyah Middlebrooks  Total critical care time: 28 minutes  Critical care time was exclusive of separately billable procedures and treating other patients.  Critical care was necessary to treat or prevent imminent or life-threatening deterioration.  Critical care was time spent personally by me on the following activities: development of treatment plan with patient and/or surrogate as well as nursing, discussions with consultants, evaluation of patient's response to treatment, examination of patient, obtaining history from patient or surrogate, ordering and performing treatments and interventions, ordering and review of laboratory studies, ordering and review of radiographic studies, pulse oximetry and re-evaluation of patient's condition.  ____________________________________________   INITIAL IMPRESSION / ASSESSMENT AND PLAN / ED COURSE  Pertinent labs & imaging results that were available during my care of the patient were reviewed by me and considered in my medical decision making (see chart for  details).   Patient presents for increasing shortness of breath associated with what he reports is a known positive COVID test.  He does appear mildly dyspneic, but was notably hypoxic with EMS but improvement on nonrebreather now.  He is protecting his airway well, sitting up, able to converse in phrases, does not appear to be in need of ventilation or advanced airway placement at this time as he is alert oriented conversant sitting upright without severe respiratory difficulty or tripoding.  We are monitoring him very closely, the patient is full code.  X-ray concerning for interstitial process likely COVID.  Lab work including repeat COVID test are pending at this time.  No evidence acute cardiac disease.    Clinical Course as of Jul 20 1629  Tue Jul 21, 2018  1541 Ongoing care and treatment assigned to Dr. Lenard Lance.  Follow-up on patient COVID test, if positive patient will need transfer to [facility] for further care and treatment of his significant COVID.  Suspect COVID, low procalcitonin, diagnosed with COVID a few days ago.  Oxygen require including nonrebreather at this time which he is tolerating well.   [MQ]  1553 [facility] hospitalist (Dr. Dalbert Batman) accepted in transfer to [facility].    [MQ]  1601 Discussed use of Actemra with patient, he would prefer disuss with doctors at [facility] about this medicine prior to use given risks of liver injury, and his LFTs are slightly elevated.   [MQ]    Clinical Course User Index [MQ] Sharyn Creamer, MD     ____________________________________________   FINAL CLINICAL IMPRESSION(S) / ED DIAGNOSES  Final diagnoses:  COVID-19 virus infection        Note:  This document was prepared using Dragon voice recognition software and may include unintentional dictation errors       Sharyn Creamer, MD 07/21/18 1542    Sharyn Creamer, MD 07/21/18 [phone]

## 2018-07-22 ENCOUNTER — Inpatient Hospital Stay (HOSPITAL_COMMUNITY): Payer: BLUE CROSS/BLUE SHIELD

## 2018-07-22 DIAGNOSIS — R7989 Other specified abnormal findings of blood chemistry: Secondary | ICD-10-CM | POA: Diagnosis present

## 2018-07-22 DIAGNOSIS — N179 Acute kidney failure, unspecified: Secondary | ICD-10-CM | POA: Diagnosis present

## 2018-07-22 DIAGNOSIS — D696 Thrombocytopenia, unspecified: Secondary | ICD-10-CM | POA: Diagnosis present

## 2018-07-22 DIAGNOSIS — E876 Hypokalemia: Secondary | ICD-10-CM | POA: Diagnosis present

## 2018-07-22 DIAGNOSIS — R739 Hyperglycemia, unspecified: Secondary | ICD-10-CM | POA: Diagnosis present

## 2018-07-22 LAB — COMPREHENSIVE METABOLIC PANEL
ALT: 48 U/L — ABNORMAL HIGH (ref 0–44)
AST: 105 U/L — ABNORMAL HIGH (ref 15–41)
Albumin: 3 g/dL — ABNORMAL LOW (ref 3.5–5.0)
Alkaline Phosphatase: 44 U/L (ref 38–126)
Anion gap: 11 (ref 5–15)
BUN: 25 mg/dL — ABNORMAL HIGH (ref 6–20)
CO2: 27 mmol/L (ref 22–32)
Calcium: 8.1 mg/dL — ABNORMAL LOW (ref 8.9–10.3)
Chloride: 95 mmol/L — ABNORMAL LOW (ref 98–111)
Creatinine, Ser: 0.96 mg/dL (ref 0.61–1.24)
GFR calc Af Amer: >60 mL/min (ref >60)
GFR calc non Af Amer: >60 mL/min (ref >60)
Glucose, Bld: 154 mg/dL — ABNORMAL HIGH (ref 70–99)
Potassium: 3.5 mmol/L (ref 3.5–5.1)
Sodium: 133 mmol/L — ABNORMAL LOW (ref 135–145)
Total Bilirubin: 1 mg/dL (ref 0.3–1.2)
Total Protein: 6.4 g/dL — ABNORMAL LOW (ref 6.5–8.1)

## 2018-07-22 LAB — POCT I-STAT 7, (LYTES, BLD GAS, ICA,H+H)
Acid-Base Excess: 6 mmol/L — ABNORMAL HIGH (ref 0.0–2.0)
Bicarbonate: 29.9 mmol/L — ABNORMAL HIGH (ref 20.0–28.0)
Calcium, Ion: 1.08 mmol/L — ABNORMAL LOW (ref 1.15–1.40)
HCT: 35 % — ABNORMAL LOW (ref 39.0–52.0)
Hemoglobin: 11.9 g/dL — ABNORMAL LOW (ref 13.0–17.0)
O2 Saturation: 96 %
Patient temperature: 100.6
Potassium: 3.3 mmol/L — ABNORMAL LOW (ref 3.5–5.1)
Sodium: 132 mmol/L — ABNORMAL LOW (ref 135–145)
TCO2: 31 mmol/L (ref 22–32)
pCO2 arterial: 40.7 mmHg (ref 32.0–48.0)
pH, Arterial: 7.477 — ABNORMAL HIGH (ref 7.350–7.450)
pO2, Arterial: 84 mmHg (ref 83.0–108.0)

## 2018-07-22 LAB — CBC WITH DIFFERENTIAL/PLATELET
Abs Immature Granulocytes: 0.03 10*3/uL (ref 0.00–0.07)
Basophils Absolute: 0 10*3/uL (ref 0.0–0.1)
Basophils Relative: 0 %
Eosinophils Absolute: 0 10*3/uL (ref 0.0–0.5)
Eosinophils Relative: 0 %
HCT: 36.4 % — ABNORMAL LOW (ref 39.0–52.0)
Hemoglobin: 12.7 g/dL — ABNORMAL LOW (ref 13.0–17.0)
Immature Granulocytes: 1 %
Lymphocytes Relative: 8 %
Lymphs Abs: 0.5 10*3/uL — ABNORMAL LOW (ref 0.7–4.0)
MCH: 30.5 pg (ref 26.0–34.0)
MCHC: 34.9 g/dL (ref 30.0–36.0)
MCV: 87.5 fL (ref 80.0–100.0)
Monocytes Absolute: 0.2 10*3/uL (ref 0.1–1.0)
Monocytes Relative: 3 %
Neutro Abs: 5.8 10*3/uL (ref 1.7–7.7)
Neutrophils Relative %: 88 %
Platelets: 134 10*3/uL — ABNORMAL LOW (ref 150–400)
RBC: 4.16 MIL/uL — ABNORMAL LOW (ref 4.22–5.81)
RDW: 13.3 % (ref 11.5–15.5)
WBC: 6.6 10*3/uL (ref 4.0–10.5)
nRBC: 0 % (ref 0.0–0.2)

## 2018-07-22 LAB — C-REACTIVE PROTEIN: CRP: 14.9 mg/dL — ABNORMAL HIGH (ref <1.0)

## 2018-07-22 LAB — D-DIMER, QUANTITATIVE: D-Dimer, Quant: 2.93 ug/mL-FEU — ABNORMAL HIGH (ref 0.00–0.50)

## 2018-07-22 LAB — HEMOGLOBIN A1C
Hgb A1c MFr Bld: 6.5 % — ABNORMAL HIGH (ref 4.8–5.6)
Mean Plasma Glucose: 139.85 mg/dL

## 2018-07-22 LAB — PHOSPHORUS: Phosphorus: 2.9 mg/dL (ref 2.5–4.6)

## 2018-07-22 LAB — HIV ANTIBODY (ROUTINE TESTING W REFLEX): HIV Screen 4th Generation wRfx: NONREACTIVE

## 2018-07-22 LAB — GLUCOSE, CAPILLARY
Glucose-Capillary: 185 mg/dL — ABNORMAL HIGH (ref 70–99)
Glucose-Capillary: 199 mg/dL — ABNORMAL HIGH (ref 70–99)
Glucose-Capillary: 175 mg/dL — ABNORMAL HIGH (ref 70–99)

## 2018-07-22 LAB — FERRITIN: Ferritin: 1723 ng/mL — ABNORMAL HIGH (ref 24–336)

## 2018-07-22 LAB — MAGNESIUM: Magnesium: 2.2 mg/dL (ref 1.7–2.4)

## 2018-07-22 LAB — LACTATE DEHYDROGENASE: LDH: 640 U/L — ABNORMAL HIGH (ref 98–192)

## 2018-07-22 LAB — ABO/RH: ABO/RH(D): O POS

## 2018-07-22 MED ORDER — ADULT MULTIVITAMIN W/MINERALS CH
1.0000 | ORAL_TABLET | Freq: Every day | ORAL | Status: DC
  Administered 2018-07-22 – 2018-07-29 (×8): 1 via ORAL
  Filled 2018-07-22 (×8): qty 1

## 2018-07-22 MED ORDER — TOCILIZUMAB 400 MG/20ML IV SOLN
800.0000 mg | Freq: Once | INTRAVENOUS | Status: AC
  Administered 2018-07-22: 800 mg via INTRAVENOUS
  Filled 2018-07-22: qty 40

## 2018-07-22 MED ORDER — ZINC SULFATE 220 (50 ZN) MG PO CAPS
220.0000 mg | ORAL_CAPSULE | Freq: Every day | ORAL | Status: DC
  Administered 2018-07-23 – 2018-07-29 (×7): 220 mg via ORAL
  Filled 2018-07-22 (×8): qty 1

## 2018-07-22 MED ORDER — ORAL CARE MOUTH RINSE
15.0000 mL | Freq: Two times a day (BID) | OROMUCOSAL | Status: DC
  Administered 2018-07-22 – 2018-07-28 (×9): 15 mL via OROMUCOSAL

## 2018-07-22 MED ORDER — VITAMIN C 500 MG PO TABS
500.0000 mg | ORAL_TABLET | Freq: Every day | ORAL | Status: DC
  Administered 2018-07-22 – 2018-07-29 (×8): 500 mg via ORAL
  Filled 2018-07-22 (×8): qty 1

## 2018-07-22 MED ORDER — ENSURE MAX PROTEIN PO LIQD
11.0000 [oz_av] | Freq: Two times a day (BID) | ORAL | Status: DC
  Administered 2018-07-22 – 2018-07-24 (×5): 11 [oz_av] via ORAL
  Filled 2018-07-22 (×15): qty 330

## 2018-07-22 MED ORDER — FAMOTIDINE 20 MG PO TABS
20.0000 mg | ORAL_TABLET | Freq: Every day | ORAL | Status: DC
  Administered 2018-07-22 – 2018-07-29 (×8): 20 mg via ORAL
  Filled 2018-07-22 (×9): qty 1

## 2018-07-22 NOTE — Progress Notes (Signed)
[facility] and [facility] Team  ReDS Research Project   Patient #: [id number]  ReDS Measurement  Right: 31%  Left: 36%

## 2018-07-22 NOTE — Progress Notes (Addendum)
PROGRESS NOTE                                                                                                                                                                                                             Patient Demographics:    Jason Huerta, is a 58 y.o. male, DOB - June 17, 1960, WUJ:[id number]  Outpatient Primary MD for the patient is Crissman, Redge GainerMark A, MD    LOS - 1  No chief complaint on file.      Brief Narrative: Patient is a 58 y.o. male history of HTN, insomnia, obesity who presented with approximately four 5-day history of worsening shortness of breath, cough fatigue and fever.  Was found to have bilateral infiltrates on chest x-ray.  COVID-19 PCR was positive.  He was felt to have acute hypoxic respiratory failure in the setting of COVID 19 viral pneumonia and [facility] hospitalist [facility].  See below for further details   Subjective:    Jason Huerta today feels maybe slightly better than yesterday-continues to have coughing spells.  Afebrile this morning.  Thinks his shortness of breath may be a wee bit better-but still gets short of breath with minimal activity.  O2 saturations were hovering around 88% while on 15 L HFNC while I was in the room.   Assessment  & Plan :   Acute Hypoxic Resp Failure due to Covid 19 Viral pneumonia: Remains essentially the same-still on 15 L of high flow oxygen-O2 saturations mostly in the 88-90% range.  Inflammatory markers appear to be significantly elevated this morning.  He does not appear to be wheezing today-plans are to continue steroids for at least 3 days following which we will discontinue.  Remains on day 2 of Remdesivir.  We discussed at length regarding experimental use of Actemra given his profound hypoxia-risks/benefits/side effects/rationale for use was all discussed in great detail-he does not have a history of TB/hepatitis  B-after much discussion-he consented to the use of Actemra.  Since he appears comfortable-we will continue to closely monitor him in the PCU-however if he develops any signs of respiratory distress-he will be moved to the ICU.   COVID-19 Labs:  Recent Labs    07/22/18 0311  DDIMER 2.93*  FERRITIN 1,723*  LDH 640*  CRP 14.9*    Lab Results  Component Value  Date   SARSCOV2NAA POSITIVE (A) 07/21/2018     COVID-19 Medications: 5/26>> Remdesivir 5/26>> Solu-Medrol 5/27>> Actemra  The treatment plan and use of Solumedrol/Actemra-its known side effects were discussed with patient,it was clearly explained that there is no definitive treatment for COVID-19 infection (apart from Remdesivir), use of the above noted medications are based on published clinical articles/anecdotal data which are not peer-reviewed or randomized control trials.  Complete risks and long-term side effects are unknown, however in the best clinical judgment they seem to have of some clinical benefit which at this time outweighs medical risks given tenuous clinical state of the patient.  Patient agree's with the treatment plan and want to receive the given medications.   ABG:    Component Value Date/Time   PHART 7.477 (H) 07/22/2018 0133   PCO2ART 40.7 07/22/2018 0133   PO2ART 84.0 07/22/2018 0133   HCO3 29.9 (H) 07/22/2018 0133   TCO2 31 07/22/2018 0133   O2SAT 96.0 07/22/2018 0133    AKI: Resolved-likely hemodynamically mediated.  Follow periodically  Mild transaminitis: Likely secondary COVID 19 infection-downtrending-follow periodically.  Hypertension: Blood pressure controlled without the use of any antihypertensives  Obesity  Condition - Extremely Guarded  Family Communication  : None at bedside-offered to call family-however patient does not want me to contact them-he will update them himself.  Code Status :  Full Code  Diet : Heart healthy  Disposition Plan  :  Remain inpatient  Consults  :  None  Procedures  : None  GI prophylaxis:H2 Blocker  DVT Prophylaxis  :  Lovenox at twice daily dosing given profound hypoxia  Lab Results  Component Value Date   PLT 134 (L) 07/22/2018    Inpatient Medications  Scheduled Meds:  enoxaparin (LOVENOX) injection  40 mg Subcutaneous Q12H   insulin aspart  0-5 Units Subcutaneous QHS   insulin aspart  0-9 Units Subcutaneous TID WC   mouth rinse  15 mL Mouth Rinse BID   methylPREDNISolone (SOLU-MEDROL) injection  40 mg Intravenous Q8H   sodium chloride flush  3 mL Intravenous Q12H   Continuous Infusions:  sodium chloride 250 mL (07/21/18 2338)   remdesivir 100 mg in NS 250 mL     tocilizumab (ACTEMRA) IV     PRN Meds:.sodium chloride, acetaminophen, chlorpheniramine-HYDROcodone, guaiFENesin-dextromethorphan, ondansetron **OR** ondansetron (ZOFRAN) IV  Antibiotics  :    Anti-infectives (From admission, onward)   Start     Dose/Rate Route Frequency Ordered Stop   07/22/18 2200  remdesivir 100 mg in sodium chloride 0.9 % 230 mL IVPB     100 mg 500 mL/hr over 30 Minutes Intravenous Every 24 hours 07/21/18 2305 07/26/18 2159   07/21/18 2330  remdesivir 200 mg in sodium chloride 0.9 % 210 mL IVPB     200 mg 500 mL/hr over 30 Minutes Intravenous Once 07/21/18 2305 07/22/18 0029       Time Spent in minutes  35    [name] M.D on 07/22/2018 at 9:42 AM  To page go to [email or web address] - use universal password  [facility] -  Office  [phone]   Admit date - 07/21/2018    1    Objective:   Vitals:   07/22/18 0008 07/22/18 0128 07/22/18 0435 07/22/18 0837  BP: 123/74  102/73 100/75  Pulse: (!) 103  77 84  Resp: (!) 26  (!) 24 20  Temp: (!) 102.3 F (39.1 C) (!) 100.6 F (38.1 C) 98.5 F (36.9 C) 97.7 F (36.5 C)  TempSrc:  Oral Oral Axillary Oral  SpO2: 93%  93% 90%  Weight:      Height:        Wt Readings from Last 3 Encounters:  07/21/18 113.4 kg  07/21/18 113.4 kg  10/31/17 104.3 kg      Intake/Output Summary (Last 24 hours) at 07/22/2018 0942 Last data filed at 07/22/2018 0700 Gross per 24 hour  Intake 316.27 ml  Output --  Net 316.27 ml     Physical Exam Gen Exam:IAlert awake-not in any distress HEENT:atraumatic, normocephalic Chest: B/L clear to auscultation anteriorly CVS:S1S2 regular Abdomen:soft non tender, non distended Extremities:no edema Neurology: Nonfocal Skin: no rash   Data Review:    CBC Recent Labs  Lab 07/21/18 1357 07/22/18 0133 07/22/18 0311  WBC 5.3  --  6.6  HGB 13.9 11.9* 12.7*  HCT 38.9* 35.0* 36.4*  PLT 144*  --  134*  MCV 84.2  --  87.5  MCH 30.1  --  30.5  MCHC 35.7  --  34.9  RDW 13.0  --  13.3  LYMPHSABS 0.5*  --  0.5*  MONOABS 0.4  --  0.2  EOSABS 0.0  --  0.0  BASOSABS 0.0  --  0.0    Chemistries  Recent Labs  Lab 07/21/18 1357 07/22/18 0133 07/22/18 0311  NA 131* 132* 133*  K 3.3* 3.3* 3.5  CL 91*  --  95*  CO2 28  --  27  GLUCOSE 170*  --  154*  BUN 27*  --  25*  CREATININE 1.45*  --  0.96  CALCIUM 8.3*  --  8.1*  MG  --   --  2.2  AST 138*  --  105*  ALT 54*  --  48*  ALKPHOS 52  --  44  BILITOT 1.3*  --  1.0   ------------------------------------------------------------------------------------------------------------------ No results for input(s): CHOL, HDL, LDLCALC, TRIG, CHOLHDL, LDLDIRECT in the last 72 hours.  Lab Results  Component Value Date   HGBA1C 6.5 (H) 07/22/2018   ------------------------------------------------------------------------------------------------------------------ No results for input(s): TSH, T4TOTAL, T3FREE, THYROIDAB in the last 72 hours.  Invalid input(s): FREET3 ------------------------------------------------------------------------------------------------------------------ Recent Labs    07/22/18 0311  FERRITIN 1,723*    Coagulation profile Recent Labs  Lab 07/21/18 1357  INR 1.0    Recent Labs    07/22/18 0311  DDIMER 2.93*    Cardiac  Enzymes No results for input(s): CKMB, TROPONINI, MYOGLOBIN in the last 168 hours.  Invalid input(s): CK ------------------------------------------------------------------------------------------------------------------    Component Value Date/Time   BNP 27.0 07/21/2018 1357    Micro Results Recent Results (from the past 240 hour(s))  SARS Coronavirus 2 (CEPHEID- Performed in [facility] hospital lab), Hosp Order     Status: Abnormal   Collection Time: 07/21/18  1:56 PM  Result Value Ref Range Status   SARS Coronavirus 2 POSITIVE (A) NEGATIVE Final    Comment: RESULT CALLED TO, READ BACK BY AND VERIFIED WITH: STEPHEN JONES  ON 07/21/2018 BY FMW (NOTE) If result is NEGATIVE SARS-CoV-2 target nucleic acids are NOT DETECTED. The SARS-CoV-2 RNA is generally detectable in upper and lower  respiratory specimens during the acute phase of infection. The lowest  concentration of SARS-CoV-2 viral copies this assay can detect is 250  copies / mL. A negative result does not preclude SARS-CoV-2 infection  and should not be used as the sole basis for treatment or other  patient management decisions.  A negative result may occur with  improper specimen collection / handling, submission  of specimen other  than nasopharyngeal swab, presence of viral mutation(s) within the  areas targeted by this assay, and inadequate number of viral copies  (<250 copies / mL). A negative result must be combined with clinical  observations, patient history, and epidemiological information. If result is POSITIVE SARS-CoV-2 target nucleic acids are DETECTE D. The SARS-CoV-2 RNA is generally detectable in upper and lower  respiratory specimens during the acute phase of infection.  Positive  results are indicative of active infection with SARS-CoV-2.  Clinical  correlation with patient history and other diagnostic information is  necessary to determine patient infection status.  Positive results do  not rule  out bacterial infection or co-infection with other viruses. If result is PRESUMPTIVE POSTIVE SARS-CoV-2 nucleic acids MAY BE PRESENT.   A presumptive positive result was obtained on the submitted specimen  and confirmed on repeat testing.  While 2019 novel coronavirus  (SARS-CoV-2) nucleic acids may be present in the submitted sample  additional confirmatory testing may be necessary for epidemiological  and / or clinical management purposes  to differentiate between  SARS-CoV-2 and other Sarbecovirus currently known to infect humans.  If clinically indicated additional testing with an alternate test  methodology (LAB745 3) is advised. The SARS-CoV-2 RNA is generally  detectable in upper and lower respiratory specimens during the acute  phase of infection. The expected result is Negative. Fact Sheet for Patients:  BoilerBrush.com.cy Fact Sheet for Healthcare Providers: https://pope.com/ This test is not yet approved or cleared by the [place] FDA and has been authorized for detection and/or diagnosis of SARS-CoV-2 by FDA under an Emergency Use Authorization (EUA).  This EUA will remain in effect (meaning this test can be used) for the duration of the COVID-19 declaration under Section 564(b)(1) of the Act, 21 U.S.C. section 360bbb-3(b)(1), unless the authorization is terminated or revoked sooner. Performed at [facility], [address] [place], [place] [address]   Blood Culture (routine x 2)     Status: None (Preliminary result)   Collection Time: 07/21/18  1:57 PM  Result Value Ref Range Status   Specimen Description BLOOD RIGHT ANTECUBITAL  Final   Special Requests   Final    BOTTLES DRAWN AEROBIC AND ANAEROBIC Blood Culture results may not be optimal due to an excessive volume of blood received in culture bottles   Culture   Final    NO GROWTH < 24 HOURS Performed at [facility], [address]  [place], [place] [address]    Report Status PENDING  Incomplete  Blood Culture (routine x 2)     Status: None (Preliminary result)   Collection Time: 07/21/18  1:57 PM  Result Value Ref Range Status   Specimen Description BLOOD BLOOD LEFT HAND  Final   Special Requests   Final    BOTTLES DRAWN AEROBIC AND ANAEROBIC Blood Culture results may not be optimal due to an excessive volume of blood received in culture bottles   Culture   Final    NO GROWTH < 24 HOURS Performed at [facility], [address], [place], [place] [address]    Report Status PENDING  Incomplete    Radiology Reports Dg Chest Port 1 View  Result Date: 07/22/2018 CLINICAL DATA:  Acute respiratory failure due to COVID-19 EXAM: PORTABLE CHEST 1 VIEW COMPARISON:  Yesterday FINDINGS: Stable generalized interstitial opacity. Normal heart size. No visible effusion or pneumothorax. IMPRESSION: Stable generalized interstitial opacity. Electronically Signed   By: Kathrynn Ducking.D.  On: 07/22/2018 08:05   Dg Chest Portable 1 View  Result Date: 07/21/2018 CLINICAL DATA:  Shortness of breath. EXAM: PORTABLE CHEST 1 VIEW COMPARISON:  No recent. FINDINGS: Mediastinum and hilar structures normal. Heart size normal. Diffuse bilateral pulmonary interstitial prominence. An active interstitial process including pneumonitis and or interstitial edema could present in this fashion. Tiny left pleural effusion cannot be excluded. No pneumothorax. Degenerative change thoracic spine. IMPRESSION: Diffuse bilateral from interstitial prominence. An active interstitial process including pneumonitis and/or interstitial edema could present this fashion. Tiny left pleural effusion cannot be excluded. Electronically Signed   By: Maisie Fus  Register   On: 07/21/2018 14:04

## 2018-07-22 NOTE — Progress Notes (Signed)
Pt. Oxygen saturations ranging between 85-88% on HIFLO. RN changed to 15L NRB, patient oxygen not improved, SPO2 levels- 79-82. Patient placed back on HIFLO and to prone. SPO2 88-90%. RT called. RT will follow patient for needs. Since patient is not in distress and asymptomatic we will continue to monitor.

## 2018-07-22 NOTE — Progress Notes (Signed)
Family updated. Daughter Lelon Mast spoken to.

## 2018-07-22 NOTE — Progress Notes (Signed)
Initial Nutrition Assessment RD working remotely.  DOCUMENTATION CODES:   Obesity unspecified  INTERVENTION:    Ensure Max po BID, each supplement provides 150 kcal and 30 grams of protein.    Multivitamin daily.  NUTRITION DIAGNOSIS:   Increased nutrient needs related to acute illness(COVID) as evidenced by estimated needs.  GOAL:   Patient will meet greater than or equal to 90% of their needs  MONITOR:   PO intake, Supplement acceptance, Labs, Skin  REASON FOR ASSESSMENT:   Malnutrition Screening Tool    ASSESSMENT:   58 yo male with PMH of HTN and Lichen planus who was admitted with SOB. COVID-19 positive.  On admission malnutrition screening tool, patient reported reduced appetite and recent poor intake. Unsure of weight loss.  Unable to speak with patient today, phone call attempt was unsuccessful.  Labs reviewed. Sodium 133 (L), A1C 6.5 (WDL for DM) CBG's: 146-185  Medications reviewed and include Novolog, Solu-medrol, vitamin C, zinc.   NUTRITION - FOCUSED PHYSICAL EXAM:  deferred  Diet Order:   Diet Order            Diet Heart Room service appropriate? Yes; Fluid consistency: Thin  Diet effective now              EDUCATION NEEDS:   No education needs have been identified at this time  Skin:  Skin Assessment: Reviewed RN Assessment  Last BM:  PTA  Height:   Ht Readings from Last 1 Encounters:  07/21/18 5\' 11"  (1.803 m)    Weight:   Wt Readings from Last 1 Encounters:  07/21/18 113.4 kg    Ideal Body Weight:  78.2 kg  BMI:  Body mass index is 34.87 kg/m.  Estimated Nutritional Needs:   Kcal:  2050-2300  Protein:  115-130 gm  Fluid:  2.2 L    Joaquin Courts, RD, LDN, CNSC Pager [phone] After Hours Pager [phone]

## 2018-07-23 LAB — COMPREHENSIVE METABOLIC PANEL
ALT: 44 U/L (ref 0–44)
AST: 69 U/L — ABNORMAL HIGH (ref 15–41)
Albumin: 2.9 g/dL — ABNORMAL LOW (ref 3.5–5.0)
Alkaline Phosphatase: 47 U/L (ref 38–126)
Anion gap: 11 (ref 5–15)
BUN: 27 mg/dL — ABNORMAL HIGH (ref 6–20)
CO2: 26 mmol/L (ref 22–32)
Calcium: 8.4 mg/dL — ABNORMAL LOW (ref 8.9–10.3)
Chloride: 101 mmol/L (ref 98–111)
Creatinine, Ser: 0.85 mg/dL (ref 0.61–1.24)
GFR calc Af Amer: >60 mL/min (ref >60)
GFR calc non Af Amer: >60 mL/min (ref >60)
Glucose, Bld: 229 mg/dL — ABNORMAL HIGH (ref 70–99)
Potassium: 3.4 mmol/L — ABNORMAL LOW (ref 3.5–5.1)
Sodium: 138 mmol/L (ref 135–145)
Total Bilirubin: 0.7 mg/dL (ref 0.3–1.2)
Total Protein: 6.2 g/dL — ABNORMAL LOW (ref 6.5–8.1)

## 2018-07-23 LAB — CBC WITH DIFFERENTIAL/PLATELET
Abs Immature Granulocytes: 0.04 10*3/uL (ref 0.00–0.07)
Basophils Absolute: 0 10*3/uL (ref 0.0–0.1)
Basophils Relative: 0 %
Eosinophils Absolute: 0 10*3/uL (ref 0.0–0.5)
Eosinophils Relative: 0 %
HCT: 39 % (ref 39.0–52.0)
Hemoglobin: 13.5 g/dL (ref 13.0–17.0)
Immature Granulocytes: 1 %
Lymphocytes Relative: 11 %
Lymphs Abs: 0.5 10*3/uL — ABNORMAL LOW (ref 0.7–4.0)
MCH: 30.1 pg (ref 26.0–34.0)
MCHC: 34.6 g/dL (ref 30.0–36.0)
MCV: 86.9 fL (ref 80.0–100.0)
Monocytes Absolute: 0.3 10*3/uL (ref 0.1–1.0)
Monocytes Relative: 7 %
Neutro Abs: 3.4 10*3/uL (ref 1.7–7.7)
Neutrophils Relative %: 81 %
Platelets: 166 10*3/uL (ref 150–400)
RBC: 4.49 MIL/uL (ref 4.22–5.81)
RDW: 13.1 % (ref 11.5–15.5)
WBC: 4.2 10*3/uL (ref 4.0–10.5)
nRBC: 0 % (ref 0.0–0.2)

## 2018-07-23 LAB — GLUCOSE, CAPILLARY
Glucose-Capillary: 233 mg/dL — ABNORMAL HIGH (ref 70–99)
Glucose-Capillary: 245 mg/dL — ABNORMAL HIGH (ref 70–99)
Glucose-Capillary: 260 mg/dL — ABNORMAL HIGH (ref 70–99)
Glucose-Capillary: 353 mg/dL — ABNORMAL HIGH (ref 70–99)

## 2018-07-23 LAB — HEPATITIS B SURFACE ANTIGEN: Hepatitis B Surface Ag: NEGATIVE

## 2018-07-23 LAB — C-REACTIVE PROTEIN: CRP: 14.1 mg/dL — ABNORMAL HIGH (ref <1.0)

## 2018-07-23 LAB — LACTATE DEHYDROGENASE: LDH: 534 U/L — ABNORMAL HIGH (ref 98–192)

## 2018-07-23 LAB — D-DIMER, QUANTITATIVE: D-Dimer, Quant: 1.79 ug/mL-FEU — ABNORMAL HIGH (ref 0.00–0.50)

## 2018-07-23 LAB — FERRITIN: Ferritin: 1493 ng/mL — ABNORMAL HIGH (ref 24–336)

## 2018-07-23 MED ORDER — POTASSIUM CHLORIDE CRYS ER 20 MEQ PO TBCR
40.0000 meq | EXTENDED_RELEASE_TABLET | Freq: Once | ORAL | Status: AC
  Administered 2018-07-23: 09:00:00 40 meq via ORAL
  Filled 2018-07-23: qty 2

## 2018-07-23 MED ORDER — FUROSEMIDE 10 MG/ML IJ SOLN
40.0000 mg | Freq: Once | INTRAMUSCULAR | Status: AC
  Administered 2018-07-23: 40 mg via INTRAVENOUS
  Filled 2018-07-23: qty 4

## 2018-07-23 MED ORDER — POTASSIUM CHLORIDE CRYS ER 20 MEQ PO TBCR
40.0000 meq | EXTENDED_RELEASE_TABLET | Freq: Once | ORAL | Status: AC
  Administered 2018-07-23: 19:00:00 40 meq via ORAL
  Filled 2018-07-23: qty 2

## 2018-07-23 NOTE — Progress Notes (Signed)
Patient spO2 low 80s when this RN entered room, pt without complaints at this time.  RN discussed with patient proning, pt agreeable and moved to prone position.  RN moved SpO2 probe to forehead, now reading 100%.  Patient resting comfortably at this time.

## 2018-07-23 NOTE — Progress Notes (Signed)
CardioVascular Research Department and AHF Team  ReDS Research Project   Patient #: [id number]  ReDS Measurement  Right: 39 %  Left: 35 %

## 2018-07-23 NOTE — Progress Notes (Signed)
Inpatient Diabetes Program Recommendations  AACE/ADA: New Consensus Statement on Inpatient Glycemic Control (2015)  Target Ranges:  Prepandial:   less than 140 mg/dL      Peak postprandial:   less than 180 mg/dL (1-2 hours)      Critically ill patients:  140 - 180 mg/dL   Lab Results  Component Value Date   GLUCAP 245 (H) 07/23/2018   HGBA1C 6.5 (H) 07/22/2018    Review of Glycemic Control  Diabetes history: none noted Outpatient Diabetes medications: none Current orders for Inpatient glycemic control: Novolog SENSITIVE correction scale TID & HS  Inpatient Diabetes Program Recommendations:    Noted that fasting CBG this am was 245 mg/dl. Recommend increasing Novolog correction scale to MODERATE (0-15 units) TID & HS if blood sugars continue to be greater than 180 mg/dl.  Note that HgbA1C is 6.5%.   Will continue to monitor blood sugars while in the hospital.  Smith Mince RN BSN CDE Diabetes Coordinator Pager: [phone]  8am-5pm

## 2018-07-23 NOTE — Progress Notes (Signed)
PROGRESS NOTE                                                                                                                                                                                                             Patient Demographics:    Jason Huerta, is a 58 y.o. male, DOB - 1960/06/27, ZJQ:[id number]  Outpatient Primary MD for the patient is Crissman, Redge Gainer, MD    LOS - 2  No chief complaint on file.      Brief Narrative: Patient is a 58 y.o. male history of HTN, insomnia, obesity who presented with approximately four 5-day history of worsening shortness of breath, cough fatigue and fever.  Was found to have bilateral infiltrates on chest x-ray.  COVID-19 PCR was positive.  He was felt to have acute hypoxic respiratory failure in the setting of COVID 19 viral pneumonia and admitted to the hospitalist service at [facility].  See below for further details   Subjective:   Seen and examined earlier this morning-RN at bedside-still on 15 L of oxygen-appears comfortable.   Assessment  & Plan :   Acute Hypoxic Resp Failure due to Covid 19 Viral pneumonia: Remains essentially the same-still requiring approximately 15 L of oxygen to maintain O2 saturations above 88-90.  Remains on Remdesivir-day 3.  Also on Solu-Medrol-did receive Actemra on 5/27.  Although no signs of volume overload-we will give 1 dose of Lasix and attempt to keep in negative balance as much as possible.  We will continue to watch 1 more day-if continues to show no signs of improvement-may require convalescent plasma. Since he appears comfortable-we will continue to closely monitor him in the PCU-however if he develops any signs of respiratory distress-he will be moved to the ICU.  COVID-19 Labs:  Recent Labs    07/22/18 0311 07/23/18 0405 07/23/18 0500  DDIMER 2.93* 1.79*  --   FERRITIN 1,723*  --  1,493*  LDH 640* 534*  --   CRP 14.9*   --  14.1*    Lab Results  Component Value Date   SARSCOV2NAA POSITIVE (A) 07/21/2018     COVID-19 Medications: 5/26>> Remdesivir 5/26>> Solu-Medrol 5/27>> Actemra  The treatment plan and use of Solumedrol/Actemra-its known side effects were discussed with patient,it was clearly explained that there is no definitive treatment for COVID-19 infection (apart from  Remdesivir), use of the above noted medications are based on published clinical articles/anecdotal data which are not peer-reviewed or randomized control trials.  Complete risks and long-term side effects are unknown, however in the best clinical judgment they seem to have of some clinical benefit which at this time outweighs medical risks given tenuous clinical state of the patient.  Patient agree's with the treatment plan and want to receive the given medications.   ABG:    Component Value Date/Time   PHART 7.477 (H) 07/22/2018 0133   PCO2ART 40.7 07/22/2018 0133   PO2ART 84.0 07/22/2018 0133   HCO3 29.9 (H) 07/22/2018 0133   TCO2 31 07/22/2018 0133   O2SAT 96.0 07/22/2018 0133    AKI: Resolved-likely hemodynamically mediated.  Follow periodically  Mild transaminitis: Likely secondary COVID 19 infection-downtrending-follow periodically.  Hypokalemia: Replete and recheck.  Hypertension: Blood pressure controlled without the use of any antihypertensives  Obesity  Condition - Extremely Guarded  Family Communication  : Patient did give me permission to talk with his daughter over the phone today. Code Status :  Full Code  Diet : Heart healthy  Disposition Plan  :  Remain inpatient  Consults  : None  Procedures  : None  GI prophylaxis:H2 Blocker  DVT Prophylaxis  :  Lovenox at twice daily dosing given profound hypoxia  Lab Results  Component Value Date   PLT 166 07/23/2018    Inpatient Medications  Scheduled Meds: . enoxaparin (LOVENOX) injection  40 mg Subcutaneous Q12H  . famotidine  20 mg Oral Daily   . furosemide  40 mg Intravenous Once  . insulin aspart  0-5 Units Subcutaneous QHS  . insulin aspart  0-9 Units Subcutaneous TID WC  . mouth rinse  15 mL Mouth Rinse BID  . methylPREDNISolone (SOLU-MEDROL) injection  40 mg Intravenous Q8H  . multivitamin with minerals  1 tablet Oral Daily  . Ensure Max Protein  11 oz Oral BID  . sodium chloride flush  3 mL Intravenous Q12H  . vitamin C  500 mg Oral Daily  . zinc sulfate  220 mg Oral Daily   Continuous Infusions: . sodium chloride Stopped (07/22/18 1100)  . remdesivir 100 mg in NS 250 mL 100 mg (07/22/18 2129)   PRN Meds:.sodium chloride, acetaminophen, chlorpheniramine-HYDROcodone, guaiFENesin-dextromethorphan, ondansetron **OR** ondansetron (ZOFRAN) IV  Antibiotics  :    Anti-infectives (From admission, onward)   Start     Dose/Rate Route Frequency Ordered Stop   07/22/18 2200  remdesivir 100 mg in sodium chloride 0.9 % 230 mL IVPB     100 mg 500 mL/hr over 30 Minutes Intravenous Every 24 hours 07/21/18 2305 07/26/18 2159   07/21/18 2330  remdesivir 200 mg in sodium chloride 0.9 % 210 mL IVPB     200 mg 500 mL/hr over 30 Minutes Intravenous Once 07/21/18 2305 07/22/18 0029       Time Spent in minutes  35    [name] M.D on 07/23/2018 at 12:24 PM  To page go to [email or web address] - use universal password  [facility] -  Office  [phone]   Admit date - 07/21/2018    2    Objective:   Vitals:   07/23/18 0300 07/23/18 0427 07/23/18 0800 07/23/18 0900  BP: 108/66 116/78 114/79   Pulse: 84 81 81 99  Resp: Temp:  98 F (36.7 C)    TempSrc:  Oral    SpO2: (!) 89% 90% (!) 89% 94%  Weight:  Height:        Wt Readings from Last 3 Encounters:  07/21/18 113.4 kg  07/21/18 113.4 kg  10/31/17 104.3 kg     Intake/Output Summary (Last 24 hours) at 07/23/2018 1224 Last data filed at 07/23/2018 0850 Gross per 24 hour  Intake 720 ml  Output 400 ml  Net 320 ml     Physical Exam  General appearance:Awake, alert, not in any distress.  Appears comfortable and easily speaking in full sentences. Eyes:no scleral icterus. HEENT: Atraumatic and Normocephalic Neck: supple, no JVD. Resp:Good air entry bilaterally, few bibasilar rales. CVS: S1 S2 regular, no murmurs.  GI: Bowel sounds present, Non tender and not distended with no gaurding, rigidity or rebound. Extremities: B/L Lower Ext shows no edema, both legs are warm to touch Neurology:  Non focal Psychiatric: Normal judgment and insight. Normal mood. Musculoskeletal:No digital cyanosis Skin:No Rash, warm and dry Wounds:N/A   Data Review:    CBC Recent Labs  Lab 07/21/18 1357 07/22/18 0133 07/22/18 0311 07/23/18 0405  WBC 5.3  --  6.6 4.2  HGB 13.9 11.9* 12.7* 13.5  HCT 38.9* 35.0* 36.4* 39.0  PLT 144*  --  134* 166  MCV 84.2  --  87.5 86.9  MCH 30.1  --  30.5 30.1  MCHC 35.7  --  34.9 34.6  RDW 13.0  --  13.3 13.1  LYMPHSABS 0.5*  --  0.5* 0.5*  MONOABS 0.4  --  0.2 0.3  EOSABS 0.0  --  0.0 0.0  BASOSABS 0.0  --  0.0 0.0    Chemistries  Recent Labs  Lab 07/21/18 1357 07/22/18 0133 07/22/18 0311 07/23/18 0405  NA 131* 132* 133* 138  K 3.3* 3.3* 3.5 3.4*  CL 91*  --  95* 101  CO2 28  --  27 26  GLUCOSE 170*  --  154* 229*  BUN 27*  --  25* 27*  CREATININE 1.45*  --  0.96 0.85  CALCIUM 8.3*  --  8.1* 8.4*  MG  --   --  2.2  --   AST 138*  --  105* 69*  ALT 54*  --  48* 44  ALKPHOS 52  --  44 47  BILITOT 1.3*  --  1.0 0.7   ------------------------------------------------------------------------------------------------------------------ No results for input(s): CHOL, HDL, LDLCALC, TRIG, CHOLHDL, LDLDIRECT in the last 72 hours.  Lab Results  Component Value Date   HGBA1C 6.5 (H) 07/22/2018   ------------------------------------------------------------------------------------------------------------------ No results for input(s): TSH, T4TOTAL, T3FREE, THYROIDAB in the last 72 hours.   Invalid input(s): FREET3 ------------------------------------------------------------------------------------------------------------------ Recent Labs    07/22/18 0311 07/23/18 0500  FERRITIN 1,723* 1,493*    Coagulation profile Recent Labs  Lab 07/21/18 1357  INR 1.0    Recent Labs    07/22/18 0311 07/23/18 0405  DDIMER 2.93* 1.79*    Cardiac Enzymes No results for input(s): CKMB, TROPONINI, MYOGLOBIN in the last 168 hours.  Invalid input(s): CK ------------------------------------------------------------------------------------------------------------------    Component Value Date/Time   BNP 27.0 07/21/2018 1357    Micro Results Recent Results (from the past 240 hour(s))  SARS Coronavirus 2 (CEPHEID- Performed in [facility] hospital lab), Hosp Order     Status: Abnormal   Collection Time: 07/21/18  1:56 PM  Result Value Ref Range Status   SARS Coronavirus 2 POSITIVE (A) NEGATIVE Final    Comment: RESULT CALLED TO, READ BACK BY AND VERIFIED WITH: STEPHEN JONES  ON 07/21/2018 BY FMW (NOTE) If result is NEGATIVE SARS-CoV-2 target  nucleic acids are NOT DETECTED. The SARS-CoV-2 RNA is generally detectable in upper and lower  respiratory specimens during the acute phase of infection. The lowest  concentration of SARS-CoV-2 viral copies this assay can detect is 250  copies / mL. A negative result does not preclude SARS-CoV-2 infection  and should not be used as the sole basis for treatment or other  patient management decisions.  A negative result may occur with  improper specimen collection / handling, submission of specimen other  than nasopharyngeal swab, presence of viral mutation(s) within the  areas targeted by this assay, and inadequate number of viral copies  (<250 copies / mL). A negative result must be combined with clinical  observations, patient history, and epidemiological information. If result is POSITIVE SARS-CoV-2 target nucleic acids are  DETECTE D. The SARS-CoV-2 RNA is generally detectable in upper and lower  respiratory specimens during the acute phase of infection.  Positive  results are indicative of active infection with SARS-CoV-2.  Clinical  correlation with patient history and other diagnostic information is  necessary to determine patient infection status.  Positive results do  not rule out bacterial infection or co-infection with other viruses. If result is PRESUMPTIVE POSTIVE SARS-CoV-2 nucleic acids MAY BE PRESENT.   A presumptive positive result was obtained on the submitted specimen  and confirmed on repeat testing.  While 2019 novel coronavirus  (SARS-CoV-2) nucleic acids may be present in the submitted sample  additional confirmatory testing may be necessary for epidemiological  and / or clinical management purposes  to differentiate between  SARS-CoV-2 and other Sarbecovirus currently known to infect humans.  If clinically indicated additional testing with an alternate test  methodology (LAB745 3) is advised. The SARS-CoV-2 RNA is generally  detectable in upper and lower respiratory specimens during the acute  phase of infection. The expected result is Negative. Fact Sheet for Patients:  BoilerBrush.com.cy Fact Sheet for Healthcare Providers: https://pope.com/ This test is not yet approved or cleared by the [place] FDA and has been authorized for detection and/or diagnosis of SARS-CoV-2 by FDA under an Emergency Use Authorization (EUA).  This EUA will remain in effect (meaning this test can be used) for the duration of the COVID-19 declaration under Section 564(b)(1) of the Act, 21 U.S.C. section 360bbb-3(b)(1), unless the authorization is terminated or revoked sooner. Performed at [facility], [address] [place], [place] [address]   Blood Culture (routine x 2)     Status: None (Preliminary result)   Collection Time: 07/21/18   1:57 PM  Result Value Ref Range Status   Specimen Description BLOOD RIGHT ANTECUBITAL  Final   Special Requests   Final    BOTTLES DRAWN AEROBIC AND ANAEROBIC Blood Culture results may not be optimal due to an excessive volume of blood received in culture bottles   Culture   Final    NO GROWTH 2 DAYS Performed at [facility], [address] [place], [place] [address]    Report Status PENDING  Incomplete  Blood Culture (routine x 2)     Status: None (Preliminary result)   Collection Time: 07/21/18  1:57 PM  Result Value Ref Range Status   Specimen Description BLOOD BLOOD LEFT HAND  Final   Special Requests   Final    BOTTLES DRAWN AEROBIC AND ANAEROBIC Blood Culture results may not be optimal due to an excessive volume of blood received in culture bottles   Culture   Final    NO GROWTH 2  DAYS Performed at [facility], [address] [place], [place] [address]    Report Status PENDING  Incomplete    Radiology Reports Dg [facility]  Result Date: 07/22/2018 CLINICAL DATA:  Acute respiratory failure due to COVID-19 EXAM: PORTABLE CHEST 1 VIEW COMPARISON:  Yesterday FINDINGS: Stable generalized interstitial opacity. Normal heart size. No visible effusion or pneumothorax. IMPRESSION: Stable generalized interstitial opacity. Electronically Signed   By: Marnee SpringJonathon  Watts M.D.   On: 07/22/2018 08:05   Dg Chest Portable 1 View  Result Date: 07/21/2018 CLINICAL DATA:  Shortness of breath. EXAM: PORTABLE CHEST 1 VIEW COMPARISON:  No recent. FINDINGS: Mediastinum and hilar structures normal. Heart size normal. Diffuse bilateral pulmonary interstitial prominence. An active interstitial process including pneumonitis and or interstitial edema could present in this fashion. Tiny left pleural effusion cannot be excluded. No pneumothorax. Degenerative change thoracic spine. IMPRESSION: Diffuse bilateral from interstitial prominence. An active interstitial process including  pneumonitis and/or interstitial edema could present this fashion. Tiny left pleural effusion cannot be excluded. Electronically Signed   By: Maisie Fushomas  Register   On: 07/21/2018 14:04

## 2018-07-24 ENCOUNTER — Inpatient Hospital Stay (HOSPITAL_COMMUNITY): Payer: BLUE CROSS/BLUE SHIELD

## 2018-07-24 LAB — CBC WITH DIFFERENTIAL/PLATELET
Abs Immature Granulocytes: 0.03 10*3/uL (ref 0.00–0.07)
Basophils Absolute: 0 10*3/uL (ref 0.0–0.1)
Basophils Relative: 0 %
Eosinophils Absolute: 0 10*3/uL (ref 0.0–0.5)
Eosinophils Relative: 0 %
HCT: 38.4 % — ABNORMAL LOW (ref 39.0–52.0)
Hemoglobin: 13.1 g/dL (ref 13.0–17.0)
Immature Granulocytes: 1 %
Lymphocytes Relative: 8 %
Lymphs Abs: 0.5 10*3/uL — ABNORMAL LOW (ref 0.7–4.0)
MCH: 29.8 pg (ref 26.0–34.0)
MCHC: 34.1 g/dL (ref 30.0–36.0)
MCV: 87.3 fL (ref 80.0–100.0)
Monocytes Absolute: 0.3 10*3/uL (ref 0.1–1.0)
Monocytes Relative: 6 %
Neutro Abs: 4.9 10*3/uL (ref 1.7–7.7)
Neutrophils Relative %: 85 %
Platelets: 210 10*3/uL (ref 150–400)
RBC: 4.4 MIL/uL (ref 4.22–5.81)
RDW: 13.1 % (ref 11.5–15.5)
WBC: 5.7 10*3/uL (ref 4.0–10.5)
nRBC: 0 % (ref 0.0–0.2)

## 2018-07-24 LAB — COMPREHENSIVE METABOLIC PANEL
ALT: 39 U/L (ref 0–44)
AST: 48 U/L — ABNORMAL HIGH (ref 15–41)
Albumin: 2.8 g/dL — ABNORMAL LOW (ref 3.5–5.0)
Alkaline Phosphatase: 49 U/L (ref 38–126)
Anion gap: 9 (ref 5–15)
BUN: 29 mg/dL — ABNORMAL HIGH (ref 6–20)
CO2: 27 mmol/L (ref 22–32)
Calcium: 8.3 mg/dL — ABNORMAL LOW (ref 8.9–10.3)
Chloride: 102 mmol/L (ref 98–111)
Creatinine, Ser: 0.87 mg/dL (ref 0.61–1.24)
GFR calc Af Amer: >60 mL/min (ref >60)
GFR calc non Af Amer: >60 mL/min (ref >60)
Glucose, Bld: 272 mg/dL — ABNORMAL HIGH (ref 70–99)
Potassium: 3.8 mmol/L (ref 3.5–5.1)
Sodium: 138 mmol/L (ref 135–145)
Total Bilirubin: 0.8 mg/dL (ref 0.3–1.2)
Total Protein: 5.6 g/dL — ABNORMAL LOW (ref 6.5–8.1)

## 2018-07-24 LAB — GLUCOSE, CAPILLARY
Glucose-Capillary: 379 mg/dL — ABNORMAL HIGH (ref 70–99)
Glucose-Capillary: 350 mg/dL — ABNORMAL HIGH (ref 70–99)
Glucose-Capillary: 290 mg/dL — ABNORMAL HIGH (ref 70–99)
Glucose-Capillary: 276 mg/dL — ABNORMAL HIGH (ref 70–99)
Glucose-Capillary: 361 mg/dL — ABNORMAL HIGH (ref 70–99)
Glucose-Capillary: 346 mg/dL — ABNORMAL HIGH (ref 70–99)

## 2018-07-24 LAB — POCT I-STAT 7, (LYTES, BLD GAS, ICA,H+H)
Acid-Base Excess: 5 mmol/L — ABNORMAL HIGH (ref 0.0–2.0)
Bicarbonate: 28.2 mmol/L — ABNORMAL HIGH (ref 20.0–28.0)
Calcium, Ion: 1.2 mmol/L (ref 1.15–1.40)
HCT: 33 % — ABNORMAL LOW (ref 39.0–52.0)
Hemoglobin: 11.2 g/dL — ABNORMAL LOW (ref 13.0–17.0)
O2 Saturation: 88 %
Patient temperature: 98.6
Potassium: 3.8 mmol/L (ref 3.5–5.1)
Sodium: 136 mmol/L (ref 135–145)
TCO2: 29 mmol/L (ref 22–32)
pCO2 arterial: 36.1 mmHg (ref 32.0–48.0)
pH, Arterial: 7.5 — ABNORMAL HIGH (ref 7.350–7.450)
pO2, Arterial: 50 mmHg — ABNORMAL LOW (ref 83.0–108.0)

## 2018-07-24 LAB — D-DIMER, QUANTITATIVE: D-Dimer, Quant: 1.26 ug/mL-FEU — ABNORMAL HIGH (ref 0.00–0.50)

## 2018-07-24 LAB — C-REACTIVE PROTEIN: CRP: 6.7 mg/dL — ABNORMAL HIGH (ref <1.0)

## 2018-07-24 LAB — ABO/RH: ABO/RH(D): O POS

## 2018-07-24 LAB — LACTATE DEHYDROGENASE: LDH: 442 U/L — ABNORMAL HIGH (ref 98–192)

## 2018-07-24 LAB — FERRITIN: Ferritin: 1162 ng/mL — ABNORMAL HIGH (ref 24–336)

## 2018-07-24 MED ORDER — SODIUM CHLORIDE 0.9% IV SOLUTION
Freq: Once | INTRAVENOUS | Status: AC
  Administered 2018-07-24: 16:00:00 via INTRAVENOUS

## 2018-07-24 MED ORDER — SALINE SPRAY 0.65 % NA SOLN
1.0000 | NASAL | Status: DC | PRN
  Filled 2018-07-24: qty 44

## 2018-07-24 MED ORDER — ASPIRIN EC 81 MG PO TBEC
81.0000 mg | DELAYED_RELEASE_TABLET | Freq: Every day | ORAL | Status: DC
  Administered 2018-07-24 – 2018-07-29 (×6): 81 mg via ORAL
  Filled 2018-07-24 (×6): qty 1

## 2018-07-24 MED ORDER — LORATADINE 10 MG PO TABS
10.0000 mg | ORAL_TABLET | Freq: Every day | ORAL | Status: DC
  Administered 2018-07-24 – 2018-07-29 (×6): 10 mg via ORAL
  Filled 2018-07-24 (×6): qty 1

## 2018-07-24 MED ORDER — FUROSEMIDE 10 MG/ML IJ SOLN
40.0000 mg | Freq: Once | INTRAMUSCULAR | Status: AC
  Administered 2018-07-24: 40 mg via INTRAVENOUS
  Filled 2018-07-24: qty 4

## 2018-07-24 NOTE — Progress Notes (Signed)
CardioVascular Research Department and AHF Team  ReDS Research Project   Patient #: [id number]  ReDS Measurement  Right: 44 %  Left: 35 %

## 2018-07-24 NOTE — Progress Notes (Signed)
This RN was called and alerted that pt was desatting into 70's. When entering the room pt was found to be asleep with HFNC on top of his forehead. HFNC reposition into nares and pt was education on importance of not removing his HFNC.

## 2018-07-24 NOTE — Progress Notes (Signed)
Pt had removed HFNC several times during the night and refuses to prone. Pt will only lay on side. MD was called and updated that pt has had 3 events where he removed HFNC and desat to 60's. ABG was ordered and RT called to obtain. This RN will continue to monitor and update MD promptly.

## 2018-07-24 NOTE — Progress Notes (Addendum)
0802  Sitting up in bed.  O2 sat 95% on 15L/HFNC.  0920  O2/HFNC reduced to 9L per MD at bedside.  O2 sat 93%    0950  O2 sats dropped to 86% while attempting sit to stand x 1.   Shortness of breath noted.  Recovered in less than a minute of rest to 92%  1715  Convalescent plasma started.  Vitals stable  1754  Plasma infusion complete.  Vitals stable.  No s/s of adverse reaction noted.

## 2018-07-24 NOTE — Progress Notes (Signed)
I, [name], MD, consented Subject Jason Huerta (male, Date of Birth 05/07/1960, 58 y.o.) and with diagnosis of COVID-19, in the [facility] Expanded Access Program (EAP) Research Protocol for [employer] against COVID-19.  The consent took place under following circumstances.   Subject Capacity assessed by this investigator as:  Presence of adequate emotional and mental capacity to consent with normal ability to read and write.  Consent took place in the following setting(s):  In-room, face to face   The following were present for the consent process:  Investigator   A copy of the cover letter and signed consent document was provided to subject/LAR.  The original signed consent document has been placed in the subject's physical chart and will be scanned into the electronic medical record upon discharge.  Statement of acknowledgement that the following was discussed with the subject/LAR:    1) Discussed the purpose of the research and procedures  2) Discussed risks and benefits and uncertainties of study participation 3) Discussed subject's responsibilities  4) Discussed the measures in place to maintain subject's confidentiality while a participant on the trial  5) Discussed alternatives to study participation.   6) Discussed study participation is voluntary and that the subject's care would not be jeopardized if they declined participation in the study.   7) Discussed freedom to withdraw at any time.   8) All subject/LAR questions were answered to their satisfaction.   9) In case of emergency consent, investigator agreed to discuss with subject/LAR at earliest available opportunity when the subject stabilizes and/or LAR can be located.     Final Investigator [address] [place], [place] [id number]   Date: 07/24/2018 and 12:10 PM

## 2018-07-24 NOTE — Plan of Care (Signed)
  Problem: Education: Goal: Knowledge of General Education information will improve Description Including pain rating scale, medication(s)/side effects and non-pharmacologic comfort measures Outcome: Progressing   Problem: Health Behavior/Discharge Planning: Goal: Ability to manage health-related needs will improve Outcome: Progressing   Problem: Clinical Measurements: Goal: Ability to maintain clinical measurements within normal limits will improve Outcome: Progressing Goal: Diagnostic test results will improve Outcome: Progressing Goal: Respiratory complications will improve Outcome: Progressing Goal: Cardiovascular complication will be avoided Outcome: Progressing   Problem: Activity: Goal: Risk for activity intolerance will decrease Outcome: Progressing   Problem: Nutrition: Goal: Adequate nutrition will be maintained Outcome: Progressing   Problem: Coping: Goal: Level of anxiety will decrease Outcome: Progressing   Problem: Elimination: Goal: Will not experience complications related to bowel motility Outcome: Progressing Goal: Will not experience complications related to urinary retention Outcome: Progressing   Problem: Pain Managment: Goal: General experience of comfort will improve Outcome: Progressing   Problem: Safety: Goal: Ability to remain free from injury will improve Outcome: Progressing   Problem: Skin Integrity: Goal: Risk for impaired skin integrity will decrease Outcome: Progressing   Problem: Education: Goal: Knowledge of risk factors and measures for prevention of condition will improve Outcome: Progressing   Problem: Coping: Goal: Psychosocial and spiritual needs will be supported Outcome: Progressing   Problem: Respiratory: Goal: Will maintain a patent airway Outcome: Progressing Goal: Complications related to the disease process, condition or treatment will be avoided or minimized Outcome: Progressing   

## 2018-07-24 NOTE — Plan of Care (Signed)
Pt continues to be noncompliant with medical treatment plan. Was found to be on side and un-prone self this morning. Pt has been educated r/t life time complication of hypoxia include brain damage and death.    Problem: Education: Goal: Knowledge of General Education information will improve Description Including pain rating scale, medication(s)/side effects and non-pharmacologic comfort measures Outcome: Not Progressing   Problem: Health Behavior/Discharge Planning: Goal: Ability to manage health-related needs will improve Outcome: Not Progressing   Problem: Clinical Measurements: Goal: Ability to maintain clinical measurements within normal limits will improve Outcome: Not Progressing Goal: Will remain free from infection Outcome: Not Progressing Goal: Diagnostic test results will improve Outcome: Not Progressing Goal: Respiratory complications will improve Outcome: Not Progressing Goal: Cardiovascular complication will be avoided Outcome: Not Progressing   Problem: Activity: Goal: Risk for activity intolerance will decrease Outcome: Not Progressing   Problem: Nutrition: Goal: Adequate nutrition will be maintained Outcome: Not Progressing   Problem: Coping: Goal: Level of anxiety will decrease Outcome: Not Progressing   Problem: Elimination: Goal: Will not experience complications related to bowel motility Outcome: Not Progressing Goal: Will not experience complications related to urinary retention Outcome: Not Progressing   Problem: Pain Managment: Goal: General experience of comfort will improve Outcome: Not Progressing   Problem: Safety: Goal: Ability to remain free from injury will improve Outcome: Not Progressing   Problem: Skin Integrity: Goal: Risk for impaired skin integrity will decrease Outcome: Not Progressing   Problem: Education: Goal: Knowledge of risk factors and measures for prevention of condition will improve Outcome: Not Progressing    Problem: Coping: Goal: Psychosocial and spiritual needs will be supported Outcome: Not Progressing   Problem: Respiratory: Goal: Will maintain a patent airway Outcome: Not Progressing Goal: Complications related to the disease process, condition or treatment will be avoided or minimized Outcome: Not Progressing

## 2018-07-24 NOTE — Progress Notes (Addendum)
PROGRESS NOTE                                                                                                                                                                                                             Patient Demographics:    Jason Huerta, is a 58 y.o. male, DOB - [birth year], ZOX:[id number]  Outpatient Primary MD for the patient is Crissman, Redge Gainer, MD    LOS - 3  No chief complaint on file.      Brief Narrative: Patient is a 58 y.o. male history of HTN, insomnia, obesity who presented with approximately four 5-day history of worsening shortness of breath, cough fatigue and fever.  Was found to have bilateral infiltrates on chest x-ray.  COVID-19 PCR was positive.  He was felt to have acute hypoxic respiratory failure in the setting of COVID 19 viral pneumonia and admitted to the hospitalist service at [facility].  See below for further details   Subjective:   Although stable at rest-gets tachypneic with minimal activity.  Low oxygen titrated down to 9-10 L-but he remains very fragile and tachypneic with long sentences.   Assessment  & Plan :  Acute Hypoxic Resp Failure due to Covid 19 Viral pneumonia: Some minimal improvement-FiO2 down to 10 L-but he is very tachypneic with minimal activity including speaking long sentences and just sitting up in bed.  Although he has improved slightly-he still is very tenuous-remains on Remdesivir-have asked pharmacy to extend to a 10-day course.  Long discussion with patient-regarding convalescent plasma-since she has improved slightly-suggested that it may be a good idea to hold off on transfusion-however since he is so symptomatic with minimal activity-he wants to proceed with convalescent plasma.  I subsequently spoke with the patient's daughter over the phone-apparently the patient and family have discussed this issue as well and want to proceed with  convalescent plasma transfusion.  Although he is tachypneic with minimal activity-he is overall relatively stable-and can still be monitored in the PCU, however if he develops any signs of worsening respiratory distress-he will be moved to the ICU.  COVID-19 Labs:  Recent Labs    07/22/18 0311 07/23/18 0405 07/23/18 0500 07/24/18 0500  DDIMER 2.93* 1.79*  --  1.26*  FERRITIN 1,723*  --  1,493* 1,162*  LDH 640* 534*  --  442*  CRP  14.9*  --  14.1* 6.7*    Lab Results  Component Value Date   SARSCOV2NAA POSITIVE (A) 07/21/2018     COVID-19 Medications: 5/26>> Remdesivir 5/26>> Solu-Medrol 5/27>> Actemra 5/29>> convalescent plasma  The treatment plan and use of Solumedrol/Actemra/convalescent plasma-its known side effects were discussed with patient,it was clearly explained that there is no definitive treatment for COVID-19 infection (apart from Remdesivir), use of the above noted medications are based on published clinical articles/anecdotal data which are not peer-reviewed or randomized control trials.  Complete risks and long-term side effects are unknown, however in the best clinical judgment they seem to have of some clinical benefit which at this time outweighs medical risks given tenuous clinical state of the patient.  Patient agree's with the treatment plan and want to receive the given medications.   ABG:    Component Value Date/Time   PHART 7.500 (H) 07/24/2018 0302   PCO2ART 36.1 07/24/2018 0302   PO2ART 50.0 (L) 07/24/2018 0302   HCO3 28.2 (H) 07/24/2018 0302   TCO2 29 07/24/2018 0302   O2SAT 88.0 07/24/2018 0302    AKI: Resolved-likely hemodynamically mediated.  Follow periodically  Mild transaminitis: Likely secondary COVID 19 infection-continues to slowly downtrend.  Hypokalemia: Replete and recheck.  Hypertension: Blood pressure controlled without the use of any antihypertensives  Obesity  Condition - Extremely Guarded  Family Communication: Daughter  over the phone  Code Status :  Full Code  Diet : Heart healthy  Disposition Plan  :  Remain inpatient  Consults  : None  Procedures  : None  GI prophylaxis:H2 Blocker  DVT Prophylaxis  :  Lovenox at twice daily dosing given profound hypoxia  Lab Results  Component Value Date   PLT 210 07/24/2018    Inpatient Medications  Scheduled Meds:  sodium chloride   Intravenous Once   enoxaparin (LOVENOX) injection  40 mg Subcutaneous Q12H   famotidine  20 mg Oral Daily   furosemide  40 mg Intravenous Once   insulin aspart  0-5 Units Subcutaneous QHS   insulin aspart  0-9 Units Subcutaneous TID WC   mouth rinse  15 mL Mouth Rinse BID   methylPREDNISolone (SOLU-MEDROL) injection  40 mg Intravenous Q8H   multivitamin with minerals  1 tablet Oral Daily   Ensure Max Protein  11 oz Oral BID   sodium chloride flush  3 mL Intravenous Q12H   vitamin C  500 mg Oral Daily   zinc sulfate  220 mg Oral Daily   Continuous Infusions:  sodium chloride Stopped (07/22/18 1100)   remdesivir 100 mg in NS 250 mL Stopped (07/23/18 2246)   PRN Meds:.sodium chloride, acetaminophen, chlorpheniramine-HYDROcodone, guaiFENesin-dextromethorphan, ondansetron **OR** ondansetron (ZOFRAN) IV  Antibiotics  :    Anti-infectives (From admission, onward)   Start     Dose/Rate Route Frequency Ordered Stop   07/22/18 2200  remdesivir 100 mg in sodium chloride 0.9 % 230 mL IVPB     100 mg 500 mL/hr over 30 Minutes Intravenous Every 24 hours 07/21/18 2305 07/26/18 2159   07/21/18 2330  remdesivir 200 mg in sodium chloride 0.9 % 210 mL IVPB     200 mg 500 mL/hr over 30 Minutes Intravenous Once 07/21/18 2305 07/22/18 0029       Time Spent in minutes  35    [name] M.D on 07/24/2018 at 11:59 AM  To page go to [email or web address] - use universal password  [facility] -  Office  [phone]   Admit date - 07/21/2018  3    Objective:   Vitals:   07/24/18 0939 07/24/18 0951  07/24/18 1145 07/24/18 1149  BP:      Pulse: 77 (!) 104 95 (!) 101  Resp: (!) 21 (!) 32 (!) 22 (!) 38  Temp:      TempSrc:      SpO2: 93% (!) 85% 92% (!) 85%  Weight:      Height:        Wt Readings from Last 3 Encounters:  07/21/18 113.4 kg  07/21/18 113.4 kg  10/31/17 104.3 kg     Intake/Output Summary (Last 24 hours) at 07/24/2018 1159 Last data filed at 07/24/2018 0741 Gross per 24 hour  Intake 590 ml  Output 950 ml  Net -360 ml     Physical Exam General appearance:Awake, alert, not in any distress.  Tachypneic with minimal activity. Eyes:no scleral icterus. HEENT: Atraumatic and Normocephalic Neck: supple, no JVD. Resp:Good air entry bilaterally, bibasilar rales up to midlung CVS: S1 S2 regular, no murmurs.  GI: Bowel sounds present, Non tender and not distended with no gaurding, rigidity or rebound. Extremities: B/L Lower Ext shows no edema, both legs are warm to touch Neurology:  Non focal Psychiatric: Normal judgment and insight. Normal mood. Musculoskeletal:No digital cyanosis Skin:No Rash, warm and dry Wounds:N/A   Data Review:    CBC Recent Labs  Lab 07/21/18 1357 07/22/18 0133 07/22/18 0311 07/23/18 0405 07/24/18 0302 07/24/18 0500  WBC 5.3  --  6.6 4.2  --  5.7  HGB 13.9 11.9* 12.7* 13.5 11.2* 13.1  HCT 38.9* 35.0* 36.4* 39.0 33.0* 38.4*  PLT 144*  --  134* 166  --  210  MCV 84.2  --  87.5 86.9  --  87.3  MCH 30.1  --  30.5 30.1  --  29.8  MCHC 35.7  --  34.9 34.6  --  34.1  RDW 13.0  --  13.3 13.1  --  13.1  LYMPHSABS 0.5*  --  0.5* 0.5*  --  0.5*  MONOABS 0.4  --  0.2 0.3  --  0.3  EOSABS 0.0  --  0.0 0.0  --  0.0  BASOSABS 0.0  --  0.0 0.0  --  0.0    Chemistries  Recent Labs  Lab 07/21/18 1357 07/22/18 0133 07/22/18 0311 07/23/18 0405 07/24/18 0302 07/24/18 0500  NA 131* 132* 133* 138 136 138  K 3.3* 3.3* 3.5 3.4* 3.8 3.8  CL 91*  --  95* 101  --  102  CO2 28  --  27 26  --  27  GLUCOSE 170*  --  154* 229*  --  272*  BUN  27*  --  25* 27*  --  29*  CREATININE 1.45*  --  0.96 0.85  --  0.87  CALCIUM 8.3*  --  8.1* 8.4*  --  8.3*  MG  --   --  2.2  --   --   --   AST 138*  --  105* 69*  --  48*  ALT 54*  --  48* 44  --  39  ALKPHOS 52  --  44 47  --  49  BILITOT 1.3*  --  1.0 0.7  --  0.8   ------------------------------------------------------------------------------------------------------------------ No results for input(s): CHOL, HDL, LDLCALC, TRIG, CHOLHDL, LDLDIRECT in the last 72 hours.  Lab Results  Component Value Date   HGBA1C 6.5 (H) 07/22/2018   ------------------------------------------------------------------------------------------------------------------ No results for input(s): TSH, T4TOTAL, T3FREE,  THYROIDAB in the last 72 hours.  Invalid input(s): FREET3 ------------------------------------------------------------------------------------------------------------------ Recent Labs    07/23/18 0500 07/24/18 0500  FERRITIN 1,493* 1,162*    Coagulation profile Recent Labs  Lab 07/21/18 1357  INR 1.0    Recent Labs    07/23/18 0405 07/24/18 0500  DDIMER 1.79* 1.26*    Cardiac Enzymes No results for input(s): CKMB, TROPONINI, MYOGLOBIN in the last 168 hours.  Invalid input(s): CK ------------------------------------------------------------------------------------------------------------------    Component Value Date/Time   BNP 27.0 07/21/2018 1357    Micro Results Recent Results (from the past 240 hour(s))  SARS Coronavirus 2 (CEPHEID- Performed in [facility] hospital lab), Hosp Order     Status: Abnormal   Collection Time: 07/21/18  1:56 PM  Result Value Ref Range Status   SARS Coronavirus 2 POSITIVE (A) NEGATIVE Final    Comment: RESULT CALLED TO, READ BACK BY AND VERIFIED WITH: STEPHEN JONES  ON 07/21/2018 BY FMW (NOTE) If result is NEGATIVE SARS-CoV-2 target nucleic acids are NOT DETECTED. The SARS-CoV-2 RNA is generally detectable in upper and lower    respiratory specimens during the acute phase of infection. The lowest  concentration of SARS-CoV-2 viral copies this assay can detect is 250  copies / mL. A negative result does not preclude SARS-CoV-2 infection  and should not be used as the sole basis for treatment or other  patient management decisions.  A negative result may occur with  improper specimen collection / handling, submission of specimen other  than nasopharyngeal swab, presence of viral mutation(s) within the  areas targeted by this assay, and inadequate number of viral copies  (<250 copies / mL). A negative result must be combined with clinical  observations, patient history, and epidemiological information. If result is POSITIVE SARS-CoV-2 target nucleic acids are DETECTE D. The SARS-CoV-2 RNA is generally detectable in upper and lower  respiratory specimens during the acute phase of infection.  Positive  results are indicative of active infection with SARS-CoV-2.  Clinical  correlation with patient history and other diagnostic information is  necessary to determine patient infection status.  Positive results do  not rule out bacterial infection or co-infection with other viruses. If result is PRESUMPTIVE POSTIVE SARS-CoV-2 nucleic acids MAY BE PRESENT.   A presumptive positive result was obtained on the submitted specimen  and confirmed on repeat testing.  While 2019 novel coronavirus  (SARS-CoV-2) nucleic acids may be present in the submitted sample  additional confirmatory testing may be necessary for epidemiological  and / or clinical management purposes  to differentiate between  SARS-CoV-2 and other Sarbecovirus currently known to infect humans.  If clinically indicated additional testing with an alternate test  methodology (LAB745 3) is advised. The SARS-CoV-2 RNA is generally  detectable in upper and lower respiratory specimens during the acute  phase of infection. The expected result is Negative. Fact  Sheet for Patients:  BoilerBrush.com.cy Fact Sheet for Healthcare Providers: https://pope.com/ This test is not yet approved or cleared by the [place] FDA and has been authorized for detection and/or diagnosis of SARS-CoV-2 by FDA under an Emergency Use Authorization (EUA).  This EUA will remain in effect (meaning this test can be used) for the duration of the COVID-19 declaration under Section 564(b)(1) of the Act, 21 U.S.C. section 360bbb-3(b)(1), unless the authorization is terminated or revoked sooner. Performed at [facility], [address] [place], [place] [address]   Blood Culture (routine x 2)     Status: None (Preliminary result)  Collection Time: 07/21/18  1:57 PM  Result Value Ref Range Status   Specimen Description BLOOD RIGHT ANTECUBITAL  Final   Special Requests   Final    BOTTLES DRAWN AEROBIC AND ANAEROBIC Blood Culture results may not be optimal due to an excessive volume of blood received in culture bottles   Culture   Final    NO GROWTH 3 DAYS Performed at [facility], [address] [place], [place] [address]    Report Status PENDING  Incomplete  Blood Culture (routine x 2)     Status: None (Preliminary result)   Collection Time: 07/21/18  1:57 PM  Result Value Ref Range Status   Specimen Description BLOOD BLOOD LEFT HAND  Final   Special Requests   Final    BOTTLES DRAWN AEROBIC AND ANAEROBIC Blood Culture results may not be optimal due to an excessive volume of blood received in culture bottles   Culture   Final    NO GROWTH 3 DAYS Performed at [facility], [address] [place], [place] [address]    Report Status PENDING  Incomplete    Radiology Reports Dg Chest Port 1 View  Result Date: 07/24/2018 CLINICAL DATA:  Shortness of breath. EXAM: PORTABLE CHEST 1 VIEW COMPARISON:  07/22/2018.  07/21/2018. FINDINGS: Heart size normal. Diffuse bilateral interstitial  prominence noted is unchanged. An active interstitial process including pneumonitis could present this fashion. No new findings noted. No pleural effusion or pneumothorax. IMPRESSION: Diffuse bilateral pulmonary interstitial prominence is again noted and is unchanged. An active interstitial process including pneumonitis could present in this fashion. Chest is unchanged from prior exams. Electronically Signed   By: Maisie Fushomas  Register   On: 07/24/2018 07:39   Dg Chest Port 1 View  Result Date: 07/22/2018 CLINICAL DATA:  Acute respiratory failure due to COVID-19 EXAM: PORTABLE CHEST 1 VIEW COMPARISON:  Yesterday FINDINGS: Stable generalized interstitial opacity. Normal heart size. No visible effusion or pneumothorax. IMPRESSION: Stable generalized interstitial opacity. Electronically Signed   By: Marnee SpringJonathon  Watts M.D.   On: 07/22/2018 08:05   Dg Chest Portable 1 View  Result Date: 07/21/2018 CLINICAL DATA:  Shortness of breath. EXAM: PORTABLE CHEST 1 VIEW COMPARISON:  No recent. FINDINGS: Mediastinum and hilar structures normal. Heart size normal. Diffuse bilateral pulmonary interstitial prominence. An active interstitial process including pneumonitis and or interstitial edema could present in this fashion. Tiny left pleural effusion cannot be excluded. No pneumothorax. Degenerative change thoracic spine. IMPRESSION: Diffuse bilateral from interstitial prominence. An active interstitial process including pneumonitis and/or interstitial edema could present this fashion. Tiny left pleural effusion cannot be excluded. Electronically Signed   By: Maisie Fushomas  Register   On: 07/21/2018 14:04

## 2018-07-24 NOTE — Progress Notes (Signed)
Results for JAYSTON, FOLGAR (MRN [id number]) as of 07/24/2018 09:07  Ref. Range 07/22/2018 21:02 07/23/2018 08:53 07/23/2018 11:47 07/23/2018 21:29 07/24/2018 07:39  Glucose-Capillary Latest Ref Range: 70 - 99 mg/dL 842 (H) 103 (H) 128 (H) 353 (H) 290 (H)  Noted that blood sugars continue to be greater than 200 mg/dl.  Recommend increasing Novolog correction scale to MODERATE (0-15 units) TID & HS scale. If blood sugars continue to be greater than 180 mg/dl and while on steroids, recommend adding low dose basal insulin Lantus 15 units daily.   Will continue to monitor blood sugars while in the hospital.   Smith Mince RN BSN CDE Diabetes Coordinator Pager: [phone]  8am-5pm

## 2018-07-24 NOTE — Progress Notes (Addendum)
Pt was prone at 0330 when this RN was on break. When this RN returned from break and enter pt room, pt was found to be supine. Pt stated that lab requested he be on his back for lab draw. This RN asked lab if they un-prone pt and they said no. Pt was again desatting into low 80's.   Pt was re-prone at 0440 and O2 sat increased to Mid 90's. Pt was educated about importance of being prone while Covid-19 positive and his High risk of intubation. He was also education on complications related to covid-19 and hypoxia including brain damage and death.

## 2018-07-25 LAB — D-DIMER, QUANTITATIVE: D-Dimer, Quant: 1.09 ug/mL-FEU — ABNORMAL HIGH (ref 0.00–0.50)

## 2018-07-25 LAB — CBC WITH DIFFERENTIAL/PLATELET
Abs Immature Granulocytes: 0.05 10*3/uL (ref 0.00–0.07)
Basophils Absolute: 0 10*3/uL (ref 0.0–0.1)
Basophils Relative: 0 %
Eosinophils Absolute: 0 10*3/uL (ref 0.0–0.5)
Eosinophils Relative: 0 %
HCT: 40.3 % (ref 39.0–52.0)
Hemoglobin: 13.9 g/dL (ref 13.0–17.0)
Immature Granulocytes: 1 %
Lymphocytes Relative: 10 %
Lymphs Abs: 0.5 10*3/uL — ABNORMAL LOW (ref 0.7–4.0)
MCH: 30.3 pg (ref 26.0–34.0)
MCHC: 34.5 g/dL (ref 30.0–36.0)
MCV: 88 fL (ref 80.0–100.0)
Monocytes Absolute: 0.4 10*3/uL (ref 0.1–1.0)
Monocytes Relative: 8 %
Neutro Abs: 4.4 10*3/uL (ref 1.7–7.7)
Neutrophils Relative %: 81 %
Platelets: 251 10*3/uL (ref 150–400)
RBC: 4.58 MIL/uL (ref 4.22–5.81)
RDW: 13.1 % (ref 11.5–15.5)
WBC: 5.5 10*3/uL (ref 4.0–10.5)
nRBC: 0 % (ref 0.0–0.2)

## 2018-07-25 LAB — COMPREHENSIVE METABOLIC PANEL
ALT: 59 U/L — ABNORMAL HIGH (ref 0–44)
AST: 70 U/L — ABNORMAL HIGH (ref 15–41)
Albumin: 3.1 g/dL — ABNORMAL LOW (ref 3.5–5.0)
Alkaline Phosphatase: 53 U/L (ref 38–126)
Anion gap: 10 (ref 5–15)
BUN: 27 mg/dL — ABNORMAL HIGH (ref 6–20)
CO2: 29 mmol/L (ref 22–32)
Calcium: 8.6 mg/dL — ABNORMAL LOW (ref 8.9–10.3)
Chloride: 101 mmol/L (ref 98–111)
Creatinine, Ser: 0.91 mg/dL (ref 0.61–1.24)
GFR calc Af Amer: >60 mL/min (ref >60)
GFR calc non Af Amer: >60 mL/min (ref >60)
Glucose, Bld: 184 mg/dL — ABNORMAL HIGH (ref 70–99)
Potassium: 3.8 mmol/L (ref 3.5–5.1)
Sodium: 140 mmol/L (ref 135–145)
Total Bilirubin: 0.6 mg/dL (ref 0.3–1.2)
Total Protein: 6.2 g/dL — ABNORMAL LOW (ref 6.5–8.1)

## 2018-07-25 LAB — GLUCOSE, CAPILLARY
Glucose-Capillary: 197 mg/dL — ABNORMAL HIGH (ref 70–99)
Glucose-Capillary: 260 mg/dL — ABNORMAL HIGH (ref 70–99)
Glucose-Capillary: 179 mg/dL — ABNORMAL HIGH (ref 70–99)
Glucose-Capillary: 149 mg/dL — ABNORMAL HIGH (ref 70–99)

## 2018-07-25 LAB — C-REACTIVE PROTEIN: CRP: 3.7 mg/dL — ABNORMAL HIGH (ref <1.0)

## 2018-07-25 LAB — FERRITIN: Ferritin: 989 ng/mL — ABNORMAL HIGH (ref 24–336)

## 2018-07-25 LAB — LACTATE DEHYDROGENASE: LDH: 426 U/L — ABNORMAL HIGH (ref 98–192)

## 2018-07-25 MED ORDER — FUROSEMIDE 10 MG/ML IJ SOLN
20.0000 mg | Freq: Once | INTRAMUSCULAR | Status: AC
  Administered 2018-07-25: 20 mg via INTRAVENOUS
  Filled 2018-07-25: qty 2

## 2018-07-25 NOTE — Progress Notes (Addendum)
0801  Resting in bed with eyes closed.  Awakens easily.  O2 sat 88% on 9L/HFNC.  Increased to 10L.    0848  Sitting up in bed.  O2 sat 99% on 10l.  Decreased to 8L/HFNC.  Sat remains 97%  1020  O2 decreased per MD to 6L.  O2 sats at 88-92%  1345  Up to toilet on room air.  Once returned to bed, O2 sat 86%.  O2 at 6L/[place] applied.  Quick recovery to 92%.  Less short of breath than yesterday

## 2018-07-25 NOTE — Progress Notes (Signed)
PROGRESS NOTE                                                                                                                                                                                                             Patient Demographics:    Jason Huerta, is a 58 y.o. male, DOB - 1960/03/28, ZOX:[id number]  Outpatient Primary MD for the patient is Crissman, Redge Gainer, MD    LOS - 4  No chief complaint on file.      Brief Narrative: Patient is a 58 y.o. male history of HTN, insomnia, obesity who presented with approximately four 5-day history of worsening shortness of breath, cough fatigue and fever.  Was found to have bilateral infiltrates on chest x-ray.  COVID-19 PCR was positive.  He was felt to have acute hypoxic respiratory failure in the setting of COVID 19 viral pneumonia and admitted to the hospitalist service at [facility].  See below for further details   Subjective:   Much better-Down to 6-7 L of oxygen this morning.   Assessment  & Plan :  Acute Hypoxic Resp Failure due to Covid 19 Viral pneumonia: Slowly getting better-looks a whole lot more comfortable compared to yesterday.  Oxygen requirements are slowly getting down-he was weaned down to 6-7 L this morning while I was in the room-his O2 saturations were consistently in the 90s.  Continue with supportive care-he remains on a Remdesivir.  Patient is s/p steroids, Actemra and convalescent plasma.  Inflammatory markers are slowly downtrending.  Continue to follow closely   COVID-19 Labs:  Recent Labs    07/23/18 0405 07/23/18 0500 07/24/18 0500 07/25/18 0353  DDIMER 1.79*  --  1.26* 1.09*  FERRITIN  --  1,493* 1,162* 989*  LDH 534*  --  442* 426*  CRP  --  14.1* 6.7* 3.7*    Lab Results  Component Value Date   SARSCOV2NAA POSITIVE (A) 07/21/2018     COVID-19 Medications: 5/26>> Remdesivir 5/26>> Solu-Medrol 5/27>> Actemra 5/29>>  convalescent plasma  The treatment plan and use of Solumedrol/Actemra/convalescent plasma-its known side effects were discussed with patient,it was clearly explained that there is no definitive treatment for COVID-19 infection (apart from Remdesivir), use of the above noted medications are based on published clinical articles/anecdotal data which are not peer-reviewed or randomized control trials.  Complete risks and  long-term side effects are unknown, however in the best clinical judgment they seem to have of some clinical benefit which at this time outweighs medical risks given tenuous clinical state of the patient.  Patient agree's with the treatment plan and want to receive the given medications.   ABG:    Component Value Date/Time   PHART 7.500 (H) 07/24/2018 0302   PCO2ART 36.1 07/24/2018 0302   PO2ART 50.0 (L) 07/24/2018 0302   HCO3 28.2 (H) 07/24/2018 0302   TCO2 29 07/24/2018 0302   O2SAT 88.0 07/24/2018 0302    AKI: Resolved-likely hemodynamically mediated.  Follow periodically  Mild transaminitis: Likely secondary COVID 19 infection-continues to slowly downtrend.  Hypokalemia: Repleted.    Hypertension: Blood pressure controlled without the use of any antihypertensives  Obesity  Condition - Extremely Guarded  Family Communication: Daughter over the phone on 5/30  Code Status :  Full Code  Diet : Heart healthy  Disposition Plan  :  Remain inpatient  Consults  : None  Procedures  : None  GI prophylaxis:H2 Blocker  DVT Prophylaxis  :  Lovenox at twice daily dosing given profound hypoxia  Lab Results  Component Value Date   PLT 251 07/25/2018    Inpatient Medications  Scheduled Meds: . aspirin EC  81 mg Oral Daily  . enoxaparin (LOVENOX) injection  40 mg Subcutaneous Q12H  . famotidine  20 mg Oral Daily  . insulin aspart  0-5 Units Subcutaneous QHS  . insulin aspart  0-9 Units Subcutaneous TID WC  . loratadine  10 mg Oral Daily  . mouth rinse  15 mL  Mouth Rinse BID  . multivitamin with minerals  1 tablet Oral Daily  . Ensure Max Protein  11 oz Oral BID  . sodium chloride flush  3 mL Intravenous Q12H  . vitamin C  500 mg Oral Daily  . zinc sulfate  220 mg Oral Daily   Continuous Infusions: . sodium chloride Stopped (07/22/18 1100)  . remdesivir 100 mg in NS 250 mL 100 mg (07/24/18 2121)   PRN Meds:.sodium chloride, acetaminophen, chlorpheniramine-HYDROcodone, guaiFENesin-dextromethorphan, ondansetron **OR** ondansetron (ZOFRAN) IV, sodium chloride  Antibiotics  :    Anti-infectives (From admission, onward)   Start     Dose/Rate Route Frequency Ordered Stop   07/22/18 2200  remdesivir 100 mg in sodium chloride 0.9 % 230 mL IVPB     100 mg 500 mL/hr over 30 Minutes Intravenous Every 24 hours 07/21/18 2305 07/26/18 2159   07/21/18 2330  remdesivir 200 mg in sodium chloride 0.9 % 210 mL IVPB     200 mg 500 mL/hr over 30 Minutes Intravenous Once 07/21/18 2305 07/22/18 0029       Time Spent in minutes  25    [name] M.D on 07/25/2018 at 1:49 PM  To page go to [email or web address] - use universal password  [facility] -  Office  [phone]   Admit date - 07/21/2018    4    Objective:   Vitals:   07/25/18 0458 07/25/18 0800 07/25/18 0820 07/25/18 1020  BP:  122/79    Pulse:  73  81  Resp:  (!) 23  13  Temp: 98 F (36.7 C)  97.8 F (36.6 C)   TempSrc: Oral  Oral   SpO2:  (!) 86%  (!) 88%  Weight:      Height:        Wt Readings from Last 3 Encounters:  07/21/18 113.4 kg  07/21/18 113.4 kg  10/31/17 104.3 kg     Intake/Output Summary (Last 24 hours) at 07/25/2018 1349 Last data filed at 07/25/2018 0458 Gross per 24 hour  Intake 220 ml  Output 1050 ml  Net -830 ml     Physical Exam General appearance:Awake, alert, not in any distress.  Speaking in full sentences-not tachypneic as compared to yesterday Eyes:no scleral icterus. HEENT: Atraumatic and Normocephalic Neck: supple, no JVD.  Resp:Good air entry bilaterally,no rales or rhonchi heard anteriorly CVS: S1 S2 regular, no murmurs.  GI: Bowel sounds present, Non tender and not distended with no gaurding, rigidity or rebound. Extremities: B/L Lower Ext shows no edema, both legs are warm to touch Neurology:  Non focal Psychiatric: Normal judgment and insight. Normal mood. Musculoskeletal:No digital cyanosis Skin:No Rash, warm and dry Wounds:N/A   Data Review:    CBC Recent Labs  Lab 07/21/18 1357  07/22/18 0311 07/23/18 0405 07/24/18 0302 07/24/18 0500 07/25/18 0353  WBC 5.3  --  6.6 4.2  --  5.7 5.5  HGB 13.9   < > 12.7* 13.5 11.2* 13.1 13.9  HCT 38.9*   < > 36.4* 39.0 33.0* 38.4* 40.3  PLT 144*  --  134* 166  --  210 251  MCV 84.2  --  87.5 86.9  --  87.3 88.0  MCH 30.1  --  30.5 30.1  --  29.8 30.3  MCHC 35.7  --  34.9 34.6  --  34.1 34.5  RDW 13.0  --  13.3 13.1  --  13.1 13.1  LYMPHSABS 0.5*  --  0.5* 0.5*  --  0.5* 0.5*  MONOABS 0.4  --  0.2 0.3  --  0.3 0.4  EOSABS 0.0  --  0.0 0.0  --  0.0 0.0  BASOSABS 0.0  --  0.0 0.0  --  0.0 0.0   < > = values in this interval not displayed.    Chemistries  Recent Labs  Lab 07/21/18 1357  07/22/18 0311 07/23/18 0405 07/24/18 0302 07/24/18 0500 07/25/18 0353  NA 131*   < > 133* 138 136 138 140  K 3.3*   < > 3.5 3.4* 3.8 3.8 3.8  CL 91*  --  95* 101  --  102 101  CO2 28  --  27 26  --  27 29  GLUCOSE 170*  --  154* 229*  --  272* 184*  BUN 27*  --  25* 27*  --  29* 27*  CREATININE 1.45*  --  0.96 0.85  --  0.87 0.91  CALCIUM 8.3*  --  8.1* 8.4*  --  8.3* 8.6*  MG  --   --  2.2  --   --   --   --   AST 138*  --  105* 69*  --  48* 70*  ALT 54*  --  48* 44  --  39 59*  ALKPHOS 52  --  44 47  --  49 53  BILITOT 1.3*  --  1.0 0.7  --  0.8 0.6   < > = values in this interval not displayed.   ------------------------------------------------------------------------------------------------------------------ No results for input(s): CHOL, HDL,  LDLCALC, TRIG, CHOLHDL, LDLDIRECT in the last 72 hours.  Lab Results  Component Value Date   HGBA1C 6.5 (H) 07/22/2018   ------------------------------------------------------------------------------------------------------------------ No results for input(s): TSH, T4TOTAL, T3FREE, THYROIDAB in the last 72 hours.  Invalid input(s): FREET3 ------------------------------------------------------------------------------------------------------------------ Recent Labs    07/24/18 0500 07/25/18 0353  FERRITIN 1,162* 989*  Coagulation profile Recent Labs  Lab 07/21/18 1357  INR 1.0    Recent Labs    07/24/18 0500 07/25/18 0353  DDIMER 1.26* 1.09*    Cardiac Enzymes No results for input(s): CKMB, TROPONINI, MYOGLOBIN in the last 168 hours.  Invalid input(s): CK ------------------------------------------------------------------------------------------------------------------    Component Value Date/Time   BNP 27.0 07/21/2018 1357    Micro Results Recent Results (from the past 240 hour(s))  SARS Coronavirus 2 (CEPHEID- Performed in [facility] hospital lab), Hosp Order     Status: Abnormal   Collection Time: 07/21/18  1:56 PM  Result Value Ref Range Status   SARS Coronavirus 2 POSITIVE (A) NEGATIVE Final    Comment: RESULT CALLED TO, READ BACK BY AND VERIFIED WITH: STEPHEN JONES  ON 07/21/2018 BY FMW (NOTE) If result is NEGATIVE SARS-CoV-2 target nucleic acids are NOT DETECTED. The SARS-CoV-2 RNA is generally detectable in upper and lower  respiratory specimens during the acute phase of infection. The lowest  concentration of SARS-CoV-2 viral copies this assay can detect is 250  copies / mL. A negative result does not preclude SARS-CoV-2 infection  and should not be used as the sole basis for treatment or other  patient management decisions.  A negative result may occur with  improper specimen collection / handling, submission of specimen other  than  nasopharyngeal swab, presence of viral mutation(s) within the  areas targeted by this assay, and inadequate number of viral copies  (<250 copies / mL). A negative result must be combined with clinical  observations, patient history, and epidemiological information. If result is POSITIVE SARS-CoV-2 target nucleic acids are DETECTE D. The SARS-CoV-2 RNA is generally detectable in upper and lower  respiratory specimens during the acute phase of infection.  Positive  results are indicative of active infection with SARS-CoV-2.  Clinical  correlation with patient history and other diagnostic information is  necessary to determine patient infection status.  Positive results do  not rule out bacterial infection or co-infection with other viruses. If result is PRESUMPTIVE POSTIVE SARS-CoV-2 nucleic acids MAY BE PRESENT.   A presumptive positive result was obtained on the submitted specimen  and confirmed on repeat testing.  While 2019 novel coronavirus  (SARS-CoV-2) nucleic acids may be present in the submitted sample  additional confirmatory testing may be necessary for epidemiological  and / or clinical management purposes  to differentiate between  SARS-CoV-2 and other Sarbecovirus currently known to infect humans.  If clinically indicated additional testing with an alternate test  methodology (LAB745 3) is advised. The SARS-CoV-2 RNA is generally  detectable in upper and lower respiratory specimens during the acute  phase of infection. The expected result is Negative. Fact Sheet for Patients:  BoilerBrush.com.cy Fact Sheet for Healthcare Providers: https://pope.com/ This test is not yet approved or cleared by the [place] FDA and has been authorized for detection and/or diagnosis of SARS-CoV-2 by FDA under an Emergency Use Authorization (EUA).  This EUA will remain in effect (meaning this test can be used) for the duration of the  COVID-19 declaration under Section 564(b)(1) of the Act, 21 U.S.C. section 360bbb-3(b)(1), unless the authorization is terminated or revoked sooner. Performed at [facility], [address] [place], [place] [address]   Blood Culture (routine x 2)     Status: None (Preliminary result)   Collection Time: 07/21/18  1:57 PM  Result Value Ref Range Status   Specimen Description BLOOD RIGHT ANTECUBITAL  Final   Special Requests   Final  BOTTLES DRAWN AEROBIC AND ANAEROBIC Blood Culture results may not be optimal due to an excessive volume of blood received in culture bottles   Culture   Final    NO GROWTH 4 DAYS Performed at [facility], [address] [place], [place] [address]    Report Status PENDING  Incomplete  Blood Culture (routine x 2)     Status: None (Preliminary result)   Collection Time: 07/21/18  1:57 PM  Result Value Ref Range Status   Specimen Description BLOOD BLOOD LEFT HAND  Final   Special Requests   Final    BOTTLES DRAWN AEROBIC AND ANAEROBIC Blood Culture results may not be optimal due to an excessive volume of blood received in culture bottles   Culture   Final    NO GROWTH 4 DAYS Performed at [facility], [address], [place], [place] [address]    Report Status PENDING  Incomplete    Radiology Reports Dg Chest Port 1 View  Result Date: 07/24/2018 CLINICAL DATA:  Shortness of breath. EXAM: PORTABLE CHEST 1 VIEW COMPARISON:  07/22/2018.  07/21/2018. FINDINGS: Heart size normal. Diffuse bilateral interstitial prominence noted is unchanged. An active interstitial process including pneumonitis could present this fashion. No new findings noted. No pleural effusion or pneumothorax. IMPRESSION: Diffuse bilateral pulmonary interstitial prominence is again noted and is unchanged. An active interstitial process including pneumonitis could present in this fashion. Chest is unchanged from prior exams. Electronically Signed   By: Maisie Fus   Register   On: 07/24/2018 07:39   Dg Chest Port 1 View  Result Date: 07/22/2018 CLINICAL DATA:  Acute respiratory failure due to COVID-19 EXAM: PORTABLE CHEST 1 VIEW COMPARISON:  Yesterday FINDINGS: Stable generalized interstitial opacity. Normal heart size. No visible effusion or pneumothorax. IMPRESSION: Stable generalized interstitial opacity. Electronically Signed   By: Marnee Spring M.D.   On: 07/22/2018 08:05   Dg Chest Portable 1 View  Result Date: 07/21/2018 CLINICAL DATA:  Shortness of breath. EXAM: PORTABLE CHEST 1 VIEW COMPARISON:  No recent. FINDINGS: Mediastinum and hilar structures normal. Heart size normal. Diffuse bilateral pulmonary interstitial prominence. An active interstitial process including pneumonitis and or interstitial edema could present in this fashion. Tiny left pleural effusion cannot be excluded. No pneumothorax. Degenerative change thoracic spine. IMPRESSION: Diffuse bilateral from interstitial prominence. An active interstitial process including pneumonitis and/or interstitial edema could present this fashion. Tiny left pleural effusion cannot be excluded. Electronically Signed   By: Maisie Fus  Register   On: 07/21/2018 14:04

## 2018-07-25 NOTE — Plan of Care (Signed)
  Problem: Education: Goal: Knowledge of General Education information will improve Description Including pain rating scale, medication(s)/side effects and non-pharmacologic comfort measures Outcome: Progressing   Problem: Health Behavior/Discharge Planning: Goal: Ability to manage health-related needs will improve Outcome: Progressing   Problem: Clinical Measurements: Goal: Ability to maintain clinical measurements within normal limits will improve Outcome: Progressing Goal: Diagnostic test results will improve Outcome: Progressing Goal: Respiratory complications will improve Outcome: Progressing Goal: Cardiovascular complication will be avoided Outcome: Progressing   Problem: Activity: Goal: Risk for activity intolerance will decrease Outcome: Progressing   Problem: Nutrition: Goal: Adequate nutrition will be maintained Outcome: Progressing   Problem: Coping: Goal: Level of anxiety will decrease Outcome: Progressing   Problem: Elimination: Goal: Will not experience complications related to bowel motility Outcome: Progressing Goal: Will not experience complications related to urinary retention Outcome: Progressing   Problem: Pain Managment: Goal: General experience of comfort will improve Outcome: Progressing   Problem: Safety: Goal: Ability to remain free from injury will improve Outcome: Progressing   Problem: Skin Integrity: Goal: Risk for impaired skin integrity will decrease Outcome: Progressing   Problem: Education: Goal: Knowledge of risk factors and measures for prevention of condition will improve Outcome: Progressing   Problem: Coping: Goal: Psychosocial and spiritual needs will be supported Outcome: Progressing   Problem: Respiratory: Goal: Will maintain a patent airway Outcome: Progressing Goal: Complications related to the disease process, condition or treatment will be avoided or minimized Outcome: Progressing   

## 2018-07-25 NOTE — Progress Notes (Signed)
CardioVascular Research Department and AHF Team  ReDS Research Project   Patient #: [id number]  ReDS Measurement  Right: 35 %  Left: 40 %

## 2018-07-26 LAB — COMPREHENSIVE METABOLIC PANEL
ALT: 94 U/L — ABNORMAL HIGH (ref 0–44)
AST: 116 U/L — ABNORMAL HIGH (ref 15–41)
Albumin: 2.9 g/dL — ABNORMAL LOW (ref 3.5–5.0)
Alkaline Phosphatase: 48 U/L (ref 38–126)
Anion gap: 11 (ref 5–15)
BUN: 25 mg/dL — ABNORMAL HIGH (ref 6–20)
CO2: 30 mmol/L (ref 22–32)
Calcium: 8.3 mg/dL — ABNORMAL LOW (ref 8.9–10.3)
Chloride: 100 mmol/L (ref 98–111)
Creatinine, Ser: 0.89 mg/dL (ref 0.61–1.24)
GFR calc Af Amer: >60 mL/min (ref >60)
GFR calc non Af Amer: >60 mL/min (ref >60)
Glucose, Bld: 156 mg/dL — ABNORMAL HIGH (ref 70–99)
Potassium: 3.5 mmol/L (ref 3.5–5.1)
Sodium: 141 mmol/L (ref 135–145)
Total Bilirubin: 0.9 mg/dL (ref 0.3–1.2)
Total Protein: 5.7 g/dL — ABNORMAL LOW (ref 6.5–8.1)

## 2018-07-26 LAB — CBC WITH DIFFERENTIAL/PLATELET
Abs Immature Granulocytes: 0.03 10*3/uL (ref 0.00–0.07)
Basophils Absolute: 0 10*3/uL (ref 0.0–0.1)
Basophils Relative: 0 %
Eosinophils Absolute: 0.1 10*3/uL (ref 0.0–0.5)
Eosinophils Relative: 2 %
HCT: 41 % (ref 39.0–52.0)
Hemoglobin: 13.5 g/dL (ref 13.0–17.0)
Immature Granulocytes: 1 %
Lymphocytes Relative: 21 %
Lymphs Abs: 0.9 10*3/uL (ref 0.7–4.0)
MCH: 29.2 pg (ref 26.0–34.0)
MCHC: 32.9 g/dL (ref 30.0–36.0)
MCV: 88.7 fL (ref 80.0–100.0)
Monocytes Absolute: 0.4 10*3/uL (ref 0.1–1.0)
Monocytes Relative: 8 %
Neutro Abs: 3.1 10*3/uL (ref 1.7–7.7)
Neutrophils Relative %: 68 %
Platelets: 217 10*3/uL (ref 150–400)
RBC: 4.62 MIL/uL (ref 4.22–5.81)
RDW: 13.1 % (ref 11.5–15.5)
WBC: 4.5 10*3/uL (ref 4.0–10.5)
nRBC: 0 % (ref 0.0–0.2)

## 2018-07-26 LAB — PREPARE FRESH FROZEN PLASMA

## 2018-07-26 LAB — BPAM FFP
Blood Product Expiration Date: 202005301530
ISSUE DATE / TIME: 202005291544
Unit Type and Rh: 5100

## 2018-07-26 LAB — D-DIMER, QUANTITATIVE: D-Dimer, Quant: 1.86 ug/mL-FEU — ABNORMAL HIGH (ref 0.00–0.50)

## 2018-07-26 LAB — CULTURE, BLOOD (ROUTINE X 2)
Culture: NO GROWTH
Culture: NO GROWTH

## 2018-07-26 LAB — FERRITIN: Ferritin: 870 ng/mL — ABNORMAL HIGH (ref 24–336)

## 2018-07-26 LAB — C-REACTIVE PROTEIN: CRP: 1.6 mg/dL — ABNORMAL HIGH (ref <1.0)

## 2018-07-26 LAB — GLUCOSE, CAPILLARY
Glucose-Capillary: 169 mg/dL — ABNORMAL HIGH (ref 70–99)
Glucose-Capillary: 161 mg/dL — ABNORMAL HIGH (ref 70–99)
Glucose-Capillary: 186 mg/dL — ABNORMAL HIGH (ref 70–99)
Glucose-Capillary: 174 mg/dL — ABNORMAL HIGH (ref 70–99)

## 2018-07-26 LAB — LACTATE DEHYDROGENASE: LDH: 449 U/L — ABNORMAL HIGH (ref 98–192)

## 2018-07-26 MED ORDER — FUROSEMIDE 10 MG/ML IJ SOLN
20.0000 mg | Freq: Once | INTRAMUSCULAR | Status: AC
  Administered 2018-07-26: 20 mg via INTRAVENOUS
  Filled 2018-07-26: qty 2

## 2018-07-26 NOTE — Progress Notes (Signed)
CardioVascular Research Department and AHF Team  ReDS Research Project   Patient #: [id number]  ReDS Measurement  Right: 43 %  Left: 40 %

## 2018-07-26 NOTE — Progress Notes (Signed)
PROGRESS NOTE                                                                                                                                                                                                             Patient Demographics:    Jason Huerta, is a 58 y.o. male, DOB - [birth year], ONG:[id number]  Outpatient Primary MD for the patient is Crissman, Redge Gainer, MD    LOS - 5  No chief complaint on file.      Brief Narrative: Patient is a 58 y.o. male history of HTN, insomnia, obesity who presented with approximately four 5-day history of worsening shortness of breath, cough fatigue and fever.  Was found to have bilateral infiltrates on chest x-ray.  COVID-19 PCR was positive.  He was felt to have acute hypoxic respiratory failure in the setting of COVID 19 viral pneumonia and admitted to the hospitalist service at [facility].  See below for further details   Subjective:   Much better-Down to 6-7 L of oxygen this morning.   Assessment  & Plan :  Acute Hypoxic Resp Failure due to Covid 19 Viral pneumonia: Slowly improve-oxygen requirements are slowly coming down-down to 5 L of oxygen this morning.  Inflammatory markers are slowly downtrending as well.Continue Remdesivir-patient is s/p steroids, Actemra and plasma.  COVID-19 Labs:  Recent Labs    07/24/18 0500 07/25/18 0353 07/26/18 0448  DDIMER 1.26* 1.09* 1.86*  FERRITIN 1,162* 989* 870*  LDH 442* 426* 449*  CRP 6.7* 3.7* 1.6*    Lab Results  Component Value Date   SARSCOV2NAA POSITIVE (A) 07/21/2018     COVID-19 Medications: 5/26>> Remdesivir 5/26>> Solu-Medrol 5/27>> Actemra 5/29>> convalescent plasma  The treatment plan and use of Solumedrol/Actemra/convalescent plasma-its known side effects were discussed with patient,it was clearly explained that there is no definitive treatment for COVID-19 infection (apart from Remdesivir), use of  the above noted medications are based on published clinical articles/anecdotal data which are not peer-reviewed or randomized control trials.  Complete risks and long-term side effects are unknown, however in the best clinical judgment they seem to have of some clinical benefit which at this time outweighs medical risks given tenuous clinical state of the patient.  Patient agree's with the treatment plan and want to receive the given medications.   ABG:    Component  Value Date/Time   PHART 7.500 (H) 07/24/2018 0302   PCO2ART 36.1 07/24/2018 0302   PO2ART 50.0 (L) 07/24/2018 0302   HCO3 28.2 (H) 07/24/2018 0302   TCO2 29 07/24/2018 0302   O2SAT 88.0 07/24/2018 0302    AKI: Resolved-likely hemodynamically mediated.  Follow periodically  Mild transaminitis: Likely secondary COVID 19 infection-continue to follow closely-as patient is on Actemra.  Hypokalemia: Repleted.    Hypertension: Blood pressure controlled without the use of any antihypertensives  Obesity  Condition - Extremely Guarded  Family Communication: Daughter over the phone on 5/30  Code Status :  Full Code  Diet : Heart healthy  Disposition Plan  :  Remain inpatient  Consults  : None  Procedures  : None  GI prophylaxis:H2 Blocker  DVT Prophylaxis  :  Lovenox at twice daily dosing given profound hypoxia  Lab Results  Component Value Date   PLT 217 07/26/2018    Inpatient Medications  Scheduled Meds: . aspirin EC  81 mg Oral Daily  . enoxaparin (LOVENOX) injection  40 mg Subcutaneous Q12H  . famotidine  20 mg Oral Daily  . insulin aspart  0-5 Units Subcutaneous QHS  . insulin aspart  0-9 Units Subcutaneous TID WC  . loratadine  10 mg Oral Daily  . mouth rinse  15 mL Mouth Rinse BID  . multivitamin with minerals  1 tablet Oral Daily  . Ensure Max Protein  11 oz Oral BID  . sodium chloride flush  3 mL Intravenous Q12H  . vitamin C  500 mg Oral Daily  . zinc sulfate  220 mg Oral Daily   Continuous  Infusions: . sodium chloride Stopped (07/25/18 2353)   PRN Meds:.sodium chloride, acetaminophen, chlorpheniramine-HYDROcodone, guaiFENesin-dextromethorphan, ondansetron **OR** ondansetron (ZOFRAN) IV, sodium chloride  Antibiotics  :    Anti-infectives (From admission, onward)   Start     Dose/Rate Route Frequency Ordered Stop   07/22/18 2200  remdesivir 100 mg in sodium chloride 0.9 % 230 mL IVPB     100 mg 500 mL/hr over 30 Minutes Intravenous Every 24 hours 07/21/18 2305 07/25/18 2321   07/21/18 2330  remdesivir 200 mg in sodium chloride 0.9 % 210 mL IVPB     200 mg 500 mL/hr over 30 Minutes Intravenous Once 07/21/18 2305 07/22/18 0029       Time Spent in minutes  25    [name] M.D on 07/26/2018 at 11:39 AM  To page go to [email or web address] - use universal password  [facility] -  Office  [phone]   Admit date - 07/21/2018    5    Objective:   Vitals:   07/25/18 2353 07/26/18 0347 07/26/18 0349 07/26/18 0816  BP: 122/84  120/84 (!) 132/94  Pulse: 81  85 91  Resp: 16  17 (!) 24  Temp: 97.9 F (36.6 C) 98.3 F (36.8 C)  97.8 F (36.6 C)  TempSrc: Oral Oral  Oral  SpO2: (!) 87%  90% 90%  Weight:      Height:        Wt Readings from Last 3 Encounters:  07/21/18 113.4 kg  07/21/18 113.4 kg  10/31/17 104.3 kg     Intake/Output Summary (Last 24 hours) at 07/26/2018 1139 Last data filed at 07/26/2018 1049 Gross per 24 hour  Intake 362.41 ml  Output 1700 ml  Net -1337.59 ml     Physical Exam General appearance:Awake, alert, not in any distress.  Eyes:no scleral icterus. HEENT: Atraumatic and Normocephalic Neck: supple,  no JVD. Resp:Good air entry bilaterally,no rales or rhonchi CVS: S1 S2 regular, no murmurs.  GI: Bowel sounds present, Non tender and not distended with no gaurding, rigidity or rebound. Extremities: B/L Lower Ext shows no edema, both legs are warm to touch Neurology:  Non focal Psychiatric: Normal judgment and insight.  Normal mood. Musculoskeletal:No digital cyanosis Skin:No Rash, warm and dry Wounds:N/A     Data Review:    CBC Recent Labs  Lab 07/22/18 0311 07/23/18 0405 07/24/18 0302 07/24/18 0500 07/25/18 0353 07/26/18 0448  WBC 6.6 4.2  --  5.7 5.5 4.5  HGB 12.7* 13.5 11.2* 13.1 13.9 13.5  HCT 36.4* 39.0 33.0* 38.4* 40.3 41.0  PLT 134* 166  --  210 251 217  MCV 87.5 86.9  --  87.3 88.0 88.7  MCH 30.5 30.1  --  29.8 30.3 29.2  MCHC 34.9 34.6  --  34.1 34.5 32.9  RDW 13.3 13.1  --  13.1 13.1 13.1  LYMPHSABS 0.5* 0.5*  --  0.5* 0.5* 0.9  MONOABS 0.2 0.3  --  0.3 0.4 0.4  EOSABS 0.0 0.0  --  0.0 0.0 0.1  BASOSABS 0.0 0.0  --  0.0 0.0 0.0    Chemistries  Recent Labs  Lab 07/22/18 0311 07/23/18 0405 07/24/18 0302 07/24/18 0500 07/25/18 0353 07/26/18 0448  NA 133* 138 136 138 140 141  K 3.5 3.4* 3.8 3.8 3.8 3.5  CL 95* 101  --  102 101 100  CO2 27 26  --  GLUCOSE 154* 229*  --  272* 184* 156*  BUN 25* 27*  --  29* 27* 25*  CREATININE 0.96 0.85  --  0.87 0.91 0.89  CALCIUM 8.1* 8.4*  --  8.3* 8.6* 8.3*  MG 2.2  --   --   --   --   --   AST 105* 69*  --  48* 70* 116*  ALT 48* 44  --  39 59* 94*  ALKPHOS 44 47  --  49 53 48  BILITOT 1.0 0.7  --  0.8 0.6 0.9   ------------------------------------------------------------------------------------------------------------------ No results for input(s): CHOL, HDL, LDLCALC, TRIG, CHOLHDL, LDLDIRECT in the last 72 hours.  Lab Results  Component Value Date   HGBA1C 6.5 (H) 07/22/2018   ------------------------------------------------------------------------------------------------------------------ No results for input(s): TSH, T4TOTAL, T3FREE, THYROIDAB in the last 72 hours.  Invalid input(s): FREET3 ------------------------------------------------------------------------------------------------------------------ Recent Labs    07/25/18 0353 07/26/18 0448  FERRITIN 989* 870*    Coagulation profile Recent Labs   Lab 07/21/18 1357  INR 1.0    Recent Labs    07/25/18 0353 07/26/18 0448  DDIMER 1.09* 1.86*    Cardiac Enzymes No results for input(s): CKMB, TROPONINI, MYOGLOBIN in the last 168 hours.  Invalid input(s): CK ------------------------------------------------------------------------------------------------------------------    Component Value Date/Time   BNP 27.0 07/21/2018 1357    Micro Results Recent Results (from the past 240 hour(s))  SARS Coronavirus 2 (CEPHEID- Performed in [facility] hospital lab), Hosp Order     Status: Abnormal   Collection Time: 07/21/18  1:56 PM  Result Value Ref Range Status   SARS Coronavirus 2 POSITIVE (A) NEGATIVE Final    Comment: RESULT CALLED TO, READ BACK BY AND VERIFIED WITH: STEPHEN JONES  ON 07/21/2018 BY FMW (NOTE) If result is NEGATIVE SARS-CoV-2 target nucleic acids are NOT DETECTED. The SARS-CoV-2 RNA is generally detectable in upper and lower  respiratory specimens during the acute phase of infection. The lowest  concentration of SARS-CoV-2 viral copies this assay can detect is 250  copies / mL. A negative result does not preclude SARS-CoV-2 infection  and should not be used as the sole basis for treatment or other  patient management decisions.  A negative result may occur with  improper specimen collection / handling, submission of specimen other  than nasopharyngeal swab, presence of viral mutation(s) within the  areas targeted by this assay, and inadequate number of viral copies  (<250 copies / mL). A negative result must be combined with clinical  observations, patient history, and epidemiological information. If result is POSITIVE SARS-CoV-2 target nucleic acids are DETECTE D. The SARS-CoV-2 RNA is generally detectable in upper and lower  respiratory specimens during the acute phase of infection.  Positive  results are indicative of active infection with SARS-CoV-2.  Clinical  correlation with patient history and  other diagnostic information is  necessary to determine patient infection status.  Positive results do  not rule out bacterial infection or co-infection with other viruses. If result is PRESUMPTIVE POSTIVE SARS-CoV-2 nucleic acids MAY BE PRESENT.   A presumptive positive result was obtained on the submitted specimen  and confirmed on repeat testing.  While 2019 novel coronavirus  (SARS-CoV-2) nucleic acids may be present in the submitted sample  additional confirmatory testing may be necessary for epidemiological  and / or clinical management purposes  to differentiate between  SARS-CoV-2 and other Sarbecovirus currently known to infect humans.  If clinically indicated additional testing with an alternate test  methodology (LAB745 3) is advised. The SARS-CoV-2 RNA is generally  detectable in upper and lower respiratory specimens during the acute  phase of infection. The expected result is Negative. Fact Sheet for Patients:  BoilerBrush.com.cyhttps://www.fda.gov/media/136312/download Fact Sheet for Healthcare Providers: https://pope.com/https://www.fda.gov/media/136313/download This test is not yet approved or cleared by the [place] FDA and has been authorized for detection and/or diagnosis of SARS-CoV-2 by FDA under an Emergency Use Authorization (EUA).  This EUA will remain in effect (meaning this test can be used) for the duration of the COVID-19 declaration under Section 564(b)(1) of the Act, 21 U.S.C. section 360bbb-3(b)(1), unless the authorization is terminated or revoked sooner. Performed at [facility], [address] [place], [place] [address]   Blood Culture (routine x 2)     Status: None   Collection Time: 07/21/18  1:57 PM  Result Value Ref Range Status   Specimen Description BLOOD RIGHT ANTECUBITAL  Final   Special Requests   Final    BOTTLES DRAWN AEROBIC AND ANAEROBIC Blood Culture results may not be optimal due to an excessive volume of blood received in culture bottles   Culture    Final    NO GROWTH 5 DAYS Performed at [facility], [address] [place], [place] [address]    Report Status 07/26/2018 FINAL  Final  Blood Culture (routine x 2)     Status: None   Collection Time: 07/21/18  1:57 PM  Result Value Ref Range Status   Specimen Description BLOOD BLOOD LEFT HAND  Final   Special Requests   Final    BOTTLES DRAWN AEROBIC AND ANAEROBIC Blood Culture results may not be optimal due to an excessive volume of blood received in culture bottles   Culture   Final    NO GROWTH 5 DAYS Performed at [facility], [address] [place], [place] [address]    Report Status 07/26/2018 FINAL  Final    Radiology Reports Dg Chest [facility]  1 View  Result Date: 07/24/2018 CLINICAL DATA:  Shortness of breath. EXAM: PORTABLE CHEST 1 VIEW COMPARISON:  07/22/2018.  07/21/2018. FINDINGS: Heart size normal. Diffuse bilateral interstitial prominence noted is unchanged. An active interstitial process including pneumonitis could present this fashion. No new findings noted. No pleural effusion or pneumothorax. IMPRESSION: Diffuse bilateral pulmonary interstitial prominence is again noted and is unchanged. An active interstitial process including pneumonitis could present in this fashion. Chest is unchanged from prior exams. Electronically Signed   By: Maisie Fus  Register   On: 07/24/2018 07:39   Dg Chest Port 1 View  Result Date: 07/22/2018 CLINICAL DATA:  Acute respiratory failure due to COVID-19 EXAM: PORTABLE CHEST 1 VIEW COMPARISON:  Yesterday FINDINGS: Stable generalized interstitial opacity. Normal heart size. No visible effusion or pneumothorax. IMPRESSION: Stable generalized interstitial opacity. Electronically Signed   By: Marnee Spring M.D.   On: 07/22/2018 08:05   Dg Chest Portable 1 View  Result Date: 07/21/2018 CLINICAL DATA:  Shortness of breath. EXAM: PORTABLE CHEST 1 VIEW COMPARISON:  No recent. FINDINGS: Mediastinum and hilar structures normal. Heart  size normal. Diffuse bilateral pulmonary interstitial prominence. An active interstitial process including pneumonitis and or interstitial edema could present in this fashion. Tiny left pleural effusion cannot be excluded. No pneumothorax. Degenerative change thoracic spine. IMPRESSION: Diffuse bilateral from interstitial prominence. An active interstitial process including pneumonitis and/or interstitial edema could present this fashion. Tiny left pleural effusion cannot be excluded. Electronically Signed   By: Maisie Fus  Register   On: 07/21/2018 14:04

## 2018-07-26 NOTE — Plan of Care (Signed)
  Problem: Education: Goal: Knowledge of General Education information will improve Description Including pain rating scale, medication(s)/side effects and non-pharmacologic comfort measures Outcome: Progressing   Problem: Health Behavior/Discharge Planning: Goal: Ability to manage health-related needs will improve Outcome: Progressing   Problem: Clinical Measurements: Goal: Ability to maintain clinical measurements within normal limits will improve Outcome: Progressing Goal: Diagnostic test results will improve Outcome: Progressing Goal: Respiratory complications will improve Outcome: Progressing Goal: Cardiovascular complication will be avoided Outcome: Progressing   Problem: Activity: Goal: Risk for activity intolerance will decrease Outcome: Progressing   Problem: Nutrition: Goal: Adequate nutrition will be maintained Outcome: Progressing   Problem: Coping: Goal: Level of anxiety will decrease Outcome: Progressing   Problem: Elimination: Goal: Will not experience complications related to bowel motility Outcome: Progressing Goal: Will not experience complications related to urinary retention Outcome: Progressing   Problem: Pain Managment: Goal: General experience of comfort will improve Outcome: Progressing   Problem: Safety: Goal: Ability to remain free from injury will improve Outcome: Progressing   Problem: Skin Integrity: Goal: Risk for impaired skin integrity will decrease Outcome: Progressing   Problem: Education: Goal: Knowledge of risk factors and measures for prevention of condition will improve Outcome: Progressing   Problem: Coping: Goal: Psychosocial and spiritual needs will be supported Outcome: Progressing   Problem: Respiratory: Goal: Will maintain a patent airway Outcome: Progressing Goal: Complications related to the disease process, condition or treatment will be avoided or minimized Outcome: Progressing   

## 2018-07-26 NOTE — Progress Notes (Addendum)
0831  Resting in bed with eyes closed.  Awakens easily.  O2 at 6l/HFNC.  Sat 89-92%.    1000  O2 decreased per MD to 4L/[place].  O2 sats 88-92%.    1650  Showered and linens changed.  O2 sat upon returning from shower on room air 86-88%.  O2 at 4l/[place] reapplied.  Immediately increased sat to 92%

## 2018-07-27 LAB — CBC
HCT: 44.1 % (ref 39.0–52.0)
Hemoglobin: 15.1 g/dL (ref 13.0–17.0)
MCH: 30.2 pg (ref 26.0–34.0)
MCHC: 34.2 g/dL (ref 30.0–36.0)
MCV: 88.2 fL (ref 80.0–100.0)
Platelets: 269 10*3/uL (ref 150–400)
RBC: 5 MIL/uL (ref 4.22–5.81)
RDW: 13.1 % (ref 11.5–15.5)
WBC: 5 10*3/uL (ref 4.0–10.5)
nRBC: 0 % (ref 0.0–0.2)

## 2018-07-27 LAB — COMPREHENSIVE METABOLIC PANEL
ALT: 94 U/L — ABNORMAL HIGH (ref 0–44)
AST: 80 U/L — ABNORMAL HIGH (ref 15–41)
Albumin: 3 g/dL — ABNORMAL LOW (ref 3.5–5.0)
Alkaline Phosphatase: 53 U/L (ref 38–126)
Anion gap: 12 (ref 5–15)
BUN: 24 mg/dL — ABNORMAL HIGH (ref 6–20)
CO2: 28 mmol/L (ref 22–32)
Calcium: 8.4 mg/dL — ABNORMAL LOW (ref 8.9–10.3)
Chloride: 99 mmol/L (ref 98–111)
Creatinine, Ser: 0.92 mg/dL (ref 0.61–1.24)
GFR calc Af Amer: >60 mL/min (ref >60)
GFR calc non Af Amer: >60 mL/min (ref >60)
Glucose, Bld: 159 mg/dL — ABNORMAL HIGH (ref 70–99)
Potassium: 3.6 mmol/L (ref 3.5–5.1)
Sodium: 139 mmol/L (ref 135–145)
Total Bilirubin: 1 mg/dL (ref 0.3–1.2)
Total Protein: 5.9 g/dL — ABNORMAL LOW (ref 6.5–8.1)

## 2018-07-27 LAB — C-REACTIVE PROTEIN: CRP: 1 mg/dL — ABNORMAL HIGH (ref <1.0)

## 2018-07-27 LAB — FERRITIN: Ferritin: 756 ng/mL — ABNORMAL HIGH (ref 24–336)

## 2018-07-27 LAB — GLUCOSE, CAPILLARY
Glucose-Capillary: 110 mg/dL — ABNORMAL HIGH (ref 70–99)
Glucose-Capillary: 155 mg/dL — ABNORMAL HIGH (ref 70–99)
Glucose-Capillary: 190 mg/dL — ABNORMAL HIGH (ref 70–99)

## 2018-07-27 LAB — LACTATE DEHYDROGENASE: LDH: 425 U/L — ABNORMAL HIGH (ref 98–192)

## 2018-07-27 NOTE — Progress Notes (Signed)
PT weaned from 4L to 3L O2 Sat. 91% currently.

## 2018-07-27 NOTE — Progress Notes (Signed)
Pt taken off HFNC and placed on 2L [place]. O2 Sat: >88. Will continue to monitor

## 2018-07-27 NOTE — Progress Notes (Signed)
Tele called notified this RN of O2 sat: 81%. [place] increased from 2L to 6L to maintain O2 Sat >88% per order. Will continue to monitor.

## 2018-07-27 NOTE — Progress Notes (Signed)
[place] decreased from 6L to 5L. O2 Sat: 91%. Will continue to monitor and wean O2.

## 2018-07-27 NOTE — Plan of Care (Signed)
Pt is weaned from 8L HFCN to [place] 5L.

## 2018-07-27 NOTE — Progress Notes (Signed)
Pt weaned to 4L from 5L O2 will continue to monitor

## 2018-07-27 NOTE — Progress Notes (Signed)
CardioVascular Research Department and AHF Team  ReDS Research Project   Patient #: [id number]  ReDS Measurement  Right: 41 %  Left: 30 %

## 2018-07-27 NOTE — Progress Notes (Signed)
PROGRESS NOTE                                                                                                                                                                                                             Patient Demographics:    Jason Huerta, is a 58 y.o. male, DOB - [birth year], KDT:[id number]  Outpatient Primary MD for the patient is Crissman, Redge Gainer, MD    LOS - 6  No chief complaint on file.      Brief Narrative: Patient is a 58 y.o. male history of HTN, insomnia, obesity who presented with approximately four 5-day history of worsening shortness of breath, cough fatigue and fever.  Was found to have bilateral infiltrates on chest x-ray.  COVID-19 PCR was positive.  He was felt to have acute hypoxic respiratory failure in the setting of COVID 19 viral pneumonia and admitted to the hospitalist service at [facility].  Initially he was requiring 12 L of oxygen via HFNC-he was managed steroids/Remdesivir/Actemra/convalescent plasma and he gradually improved-with decreasing oxygen requirements.  See below for further details   Subjective:   He was better-down to 5 L this morning.  Claims he is able to ambulate much more compared to just a few days back.  Inquiring about discharge-breath clearly needs to stay a few more days.     Assessment  & Plan :  Acute Hypoxic Resp Failure due to Covid 19 Viral pneumonia: Continues to slowly improve-down to 5 L of oxygen this morning.  Patient has completed a course of Remdesivir and steroids.  He also required Actemra x1 and convalescent plasma.  Inflammatory markers are downtrending.  No volume overload evident on exam-do not think he requires Lasix today.  Nursing staff asked to slowly continue to titrate down oxygen.  Hopefully he will continue to improve and will be discharged home in a few days.  He will need to be assessed for home O2 requirement prior to  discharge.  COVID-19 Labs:  Recent Labs    07/25/18 0353 07/26/18 0448 07/27/18 0403  DDIMER 1.09* 1.86*  --   FERRITIN 989* 870* 756*  LDH 426* 449* 425*  CRP 3.7* 1.6* 1.0*    Lab Results  Component Value Date   SARSCOV2NAA POSITIVE (A) 07/21/2018     COVID-19 Medications: 5/26>> Remdesivir 5/26>> Solu-Medrol 5/27>> Actemra  5/29>> convalescent plasma  The treatment plan and use of Solumedrol/Actemra/convalescent plasma-its known side effects were discussed with patient,it was clearly explained that there is no definitive treatment for COVID-19 infection (apart from Remdesivir), use of the above noted medications are based on published clinical articles/anecdotal data which are not peer-reviewed or randomized control trials.  Complete risks and long-term side effects are unknown, however in the best clinical judgment they seem to have of some clinical benefit which at this time outweighs medical risks given tenuous clinical state of the patient.  Patient agree's with the treatment plan and want to receive the given medications.   ABG:    Component Value Date/Time   PHART 7.500 (H) 07/24/2018 0302   PCO2ART 36.1 07/24/2018 0302   PO2ART 50.0 (L) 07/24/2018 0302   HCO3 28.2 (H) 07/24/2018 0302   TCO2 29 07/24/2018 0302   O2SAT 88.0 07/24/2018 0302    AKI: Resolved-likely hemodynamically mediated.  Follow periodically  Mild transaminitis: Likely secondary COVID 19 infection-continues to slowly downtrend.  Follow periodically as patient is s/p Remdesivir and Actemra.    Hypokalemia: Repleted.    Hypertension: Blood pressure controlled without the use of any antihypertensives  Obesity  Condition - Extremely Guarded  Family Communication: Daughter over the phone on 5/30  Code Status :  Full Code  Diet : Heart healthy  Disposition Plan  :  Remain inpatient  Consults  : None  Procedures  : None  GI prophylaxis:H2 Blocker  DVT Prophylaxis  :  Lovenox at twice  daily dosing given profound hypoxia  Lab Results  Component Value Date   PLT 269 07/27/2018    Inpatient Medications  Scheduled Meds:  aspirin EC  81 mg Oral Daily   enoxaparin (LOVENOX) injection  40 mg Subcutaneous Q12H   famotidine  20 mg Oral Daily   insulin aspart  0-5 Units Subcutaneous QHS   insulin aspart  0-9 Units Subcutaneous TID WC   loratadine  10 mg Oral Daily   mouth rinse  15 mL Mouth Rinse BID   multivitamin with minerals  1 tablet Oral Daily   Ensure Max Protein  11 oz Oral BID   sodium chloride flush  3 mL Intravenous Q12H   vitamin C  500 mg Oral Daily   zinc sulfate  220 mg Oral Daily   Continuous Infusions:  sodium chloride Stopped (07/25/18 2353)   PRN Meds:.sodium chloride, acetaminophen, chlorpheniramine-HYDROcodone, guaiFENesin-dextromethorphan, ondansetron **OR** ondansetron (ZOFRAN) IV, sodium chloride  Antibiotics  :    Anti-infectives (From admission, onward)   Start     Dose/Rate Route Frequency Ordered Stop   07/22/18 2200  remdesivir 100 mg in sodium chloride 0.9 % 230 mL IVPB     100 mg 500 mL/hr over 30 Minutes Intravenous Every 24 hours 07/21/18 2305 07/25/18 2321   07/21/18 2330  remdesivir 200 mg in sodium chloride 0.9 % 210 mL IVPB     200 mg 500 mL/hr over 30 Minutes Intravenous Once 07/21/18 2305 07/22/18 0029       Time Spent in minutes  25    [name] M.D on 07/27/2018 at 1:08 PM  To page go to [email or web address] - use universal password  [facility] -  Office  [phone]   Admit date - 07/21/2018    6    Objective:   Vitals:   07/27/18 1100 07/27/18 1200 07/27/18 1238 07/27/18 1300  BP:   115/87   Pulse: 93 96 (!) 56 100  Resp: (!) 23 (!)  22 (!) 27 (!) 21  Temp:      TempSrc:      SpO2: (!) 85% (!) 88% (!) 62% (!) 89%  Weight:      Height:        Wt Readings from Last 3 Encounters:  07/21/18 113.4 kg  07/21/18 113.4 kg  10/31/17 104.3 kg     Intake/Output Summary (Last 24  hours) at 07/27/2018 1308 Last data filed at 07/27/2018 1100 Gross per 24 hour  Intake 3 ml  Output 1350 ml  Net -1347 ml     Physical Exam General appearance:Awake, alert, not in any distress.  Eyes:no scleral icterus. HEENT: Atraumatic and Normocephalic Neck: supple, no JVD. Resp:Good air entry bilaterally, few bibasilar rales CVS: S1 S2 regular, no murmurs.  GI: Bowel sounds present, Non tender and not distended with no gaurding, rigidity or rebound. Extremities: B/L Lower Ext shows no edema, both legs are warm to touch Neurology:  Non focal Psychiatric: Normal judgment and insight. Normal mood. Musculoskeletal:No digital cyanosis Skin:No Rash, warm and dry Wounds:N/A    Data Review:    CBC Recent Labs  Lab 07/22/18 0311 07/23/18 0405 07/24/18 0302 07/24/18 0500 07/25/18 0353 07/26/18 0448 07/27/18 0403  WBC 6.6 4.2  --  5.7 5.5 4.5 5.0  HGB 12.7* 13.5 11.2* 13.1 13.9 13.5 15.1  HCT 36.4* 39.0 33.0* 38.4* 40.3 41.0 44.1  PLT 134* 166  --  210 251 217 269  MCV 87.5 86.9  --  87.3 88.0 88.7 88.2  MCH 30.5 30.1  --  29.8 30.3 29.2 30.2  MCHC 34.9 34.6  --  34.1 34.5 32.9 34.2  RDW 13.3 13.1  --  13.1 13.1 13.1 13.1  LYMPHSABS 0.5* 0.5*  --  0.5* 0.5* 0.9  --   MONOABS 0.2 0.3  --  0.3 0.4 0.4  --   EOSABS 0.0 0.0  --  0.0 0.0 0.1  --   BASOSABS 0.0 0.0  --  0.0 0.0 0.0  --     Chemistries  Recent Labs  Lab 07/22/18 0311 07/23/18 0405 07/24/18 0302 07/24/18 0500 07/25/18 0353 07/26/18 0448 07/27/18 0403  NA 133* 138 136 138 140 141 139  K 3.5 3.4* 3.8 3.8 3.8 3.5 3.6  CL 95* 101  --  102 101 100 99  CO2 27 26  --  GLUCOSE 154* 229*  --  272* 184* 156* 159*  BUN 25* 27*  --  29* 27* 25* 24*  CREATININE 0.96 0.85  --  0.87 0.91 0.89 0.92  CALCIUM 8.1* 8.4*  --  8.3* 8.6* 8.3* 8.4*  MG 2.2  --   --   --   --   --   --   AST 105* 69*  --  48* 70* 116* 80*  ALT 48* 44  --  39 59* 94* 94*  ALKPHOS 44 47  --  49 53 48 53  BILITOT 1.0 0.7  --   0.8 0.6 0.9 1.0   ------------------------------------------------------------------------------------------------------------------ No results for input(s): CHOL, HDL, LDLCALC, TRIG, CHOLHDL, LDLDIRECT in the last 72 hours.  Lab Results  Component Value Date   HGBA1C 6.5 (H) 07/22/2018   ------------------------------------------------------------------------------------------------------------------ No results for input(s): TSH, T4TOTAL, T3FREE, THYROIDAB in the last 72 hours.  Invalid input(s): FREET3 ------------------------------------------------------------------------------------------------------------------ Recent Labs    07/26/18 0448 07/27/18 0403  FERRITIN 870* 756*    Coagulation profile Recent Labs  Lab 07/21/18 1357  INR 1.0  Recent Labs    07/25/18 0353 07/26/18 0448  DDIMER 1.09* 1.86*    Cardiac Enzymes No results for input(s): CKMB, TROPONINI, MYOGLOBIN in the last 168 hours.  Invalid input(s): CK ------------------------------------------------------------------------------------------------------------------    Component Value Date/Time   BNP 27.0 07/21/2018 1357    Micro Results Recent Results (from the past 240 hour(s))  SARS Coronavirus 2 (CEPHEID- Performed in [facility] hospital lab), Hosp Order     Status: Abnormal   Collection Time: 07/21/18  1:56 PM  Result Value Ref Range Status   SARS Coronavirus 2 POSITIVE (A) NEGATIVE Final    Comment: RESULT CALLED TO, READ BACK BY AND VERIFIED WITH: STEPHEN JONES @1538  ON 07/21/2018 BY FMW (NOTE) If result is NEGATIVE SARS-CoV-2 target nucleic acids are NOT DETECTED. The SARS-CoV-2 RNA is generally detectable in upper and lower  respiratory specimens during the acute phase of infection. The lowest  concentration of SARS-CoV-2 viral copies this assay can detect is 250  copies / mL. A negative result does not preclude SARS-CoV-2 infection  and should not be used as the sole basis for  treatment or other  patient management decisions.  A negative result may occur with  improper specimen collection / handling, submission of specimen other  than nasopharyngeal swab, presence of viral mutation(s) within the  areas targeted by this assay, and inadequate number of viral copies  (<250 copies / mL). A negative result must be combined with clinical  observations, patient history, and epidemiological information. If result is POSITIVE SARS-CoV-2 target nucleic acids are DETECTE D. The SARS-CoV-2 RNA is generally detectable in upper and lower  respiratory specimens during the acute phase of infection.  Positive  results are indicative of active infection with SARS-CoV-2.  Clinical  correlation with patient history and other diagnostic information is  necessary to determine patient infection status.  Positive results do  not rule out bacterial infection or co-infection with other viruses. If result is PRESUMPTIVE POSTIVE SARS-CoV-2 nucleic acids MAY BE PRESENT.   A presumptive positive result was obtained on the submitted specimen  and confirmed on repeat testing.  While 2019 novel coronavirus  (SARS-CoV-2) nucleic acids may be present in the submitted sample  additional confirmatory testing may be necessary for epidemiological  and / or clinical management purposes  to differentiate between  SARS-CoV-2 and other Sarbecovirus currently known to infect humans.  If clinically indicated additional testing with an alternate test  methodology (LAB745 3) is advised. The SARS-CoV-2 RNA is generally  detectable in upper and lower respiratory specimens during the acute  phase of infection. The expected result is Negative. Fact Sheet for Patients:  BoilerBrush.com.cyhttps://www.fda.gov/media/136312/download Fact Sheet for Healthcare Providers: https://pope.com/https://www.fda.gov/media/136313/download This test is not yet approved or cleared by the [place] FDA and has been authorized for detection and/or  diagnosis of SARS-CoV-2 by FDA under an Emergency Use Authorization (EUA).  This EUA will remain in effect (meaning this test can be used) for the duration of the COVID-19 declaration under Section 564(b)(1) of the Act, 21 U.S.C. section 360bbb-3(b)(1), unless the authorization is terminated or revoked sooner. Performed at [facility], [address] [place], [place] [address]   Blood Culture (routine x 2)     Status: None   Collection Time: 07/21/18  1:57 PM  Result Value Ref Range Status   Specimen Description BLOOD RIGHT ANTECUBITAL  Final   Special Requests   Final    BOTTLES DRAWN AEROBIC AND ANAEROBIC Blood Culture results may not be optimal due  to an excessive volume of blood received in culture bottles   Culture   Final    NO GROWTH 5 DAYS Performed at [facility], [address] [place], [place] [address]    Report Status 07/26/2018 FINAL  Final  Blood Culture (routine x 2)     Status: None   Collection Time: 07/21/18  1:57 PM  Result Value Ref Range Status   Specimen Description BLOOD BLOOD LEFT HAND  Final   Special Requests   Final    BOTTLES DRAWN AEROBIC AND ANAEROBIC Blood Culture results may not be optimal due to an excessive volume of blood received in culture bottles   Culture   Final    NO GROWTH 5 DAYS Performed at [facility], [address] [place], [place] [address]    Report Status 07/26/2018 FINAL  Final    Radiology Reports Dg Chest Port 1 View  Result Date: 07/24/2018 CLINICAL DATA:  Shortness of breath. EXAM: PORTABLE CHEST 1 VIEW COMPARISON:  07/22/2018.  07/21/2018. FINDINGS: Heart size normal. Diffuse bilateral interstitial prominence noted is unchanged. An active interstitial process including pneumonitis could present this fashion. No new findings noted. No pleural effusion or pneumothorax. IMPRESSION: Diffuse bilateral pulmonary interstitial prominence is again noted and is unchanged. An active interstitial  process including pneumonitis could present in this fashion. Chest is unchanged from prior exams. Electronically Signed   By: Maisie Fus  Register   On: 07/24/2018 07:39   Dg Chest Port 1 View  Result Date: 07/22/2018 CLINICAL DATA:  Acute respiratory failure due to COVID-19 EXAM: PORTABLE CHEST 1 VIEW COMPARISON:  Yesterday FINDINGS: Stable generalized interstitial opacity. Normal heart size. No visible effusion or pneumothorax. IMPRESSION: Stable generalized interstitial opacity. Electronically Signed   By: Marnee Spring M.D.   On: 07/22/2018 08:05   Dg Chest Portable 1 View  Result Date: 07/21/2018 CLINICAL DATA:  Shortness of breath. EXAM: PORTABLE CHEST 1 VIEW COMPARISON:  No recent. FINDINGS: Mediastinum and hilar structures normal. Heart size normal. Diffuse bilateral pulmonary interstitial prominence. An active interstitial process including pneumonitis and or interstitial edema could present in this fashion. Tiny left pleural effusion cannot be excluded. No pneumothorax. Degenerative change thoracic spine. IMPRESSION: Diffuse bilateral from interstitial prominence. An active interstitial process including pneumonitis and/or interstitial edema could present this fashion. Tiny left pleural effusion cannot be excluded. Electronically Signed   By: Maisie Fus  Register   On: 07/21/2018 14:04

## 2018-07-28 ENCOUNTER — Inpatient Hospital Stay (HOSPITAL_COMMUNITY): Payer: BLUE CROSS/BLUE SHIELD

## 2018-07-28 LAB — CBC
HCT: 46.2 % (ref 39.0–52.0)
Hemoglobin: 15.9 g/dL (ref 13.0–17.0)
MCH: 30.6 pg (ref 26.0–34.0)
MCHC: 34.4 g/dL (ref 30.0–36.0)
MCV: 88.8 fL (ref 80.0–100.0)
Platelets: 279 10*3/uL (ref 150–400)
RBC: 5.2 MIL/uL (ref 4.22–5.81)
RDW: 13.1 % (ref 11.5–15.5)
WBC: 4.6 10*3/uL (ref 4.0–10.5)
nRBC: 0 % (ref 0.0–0.2)

## 2018-07-28 LAB — COMPREHENSIVE METABOLIC PANEL
ALT: 78 U/L — ABNORMAL HIGH (ref 0–44)
AST: 61 U/L — ABNORMAL HIGH (ref 15–41)
Albumin: 3.1 g/dL — ABNORMAL LOW (ref 3.5–5.0)
Alkaline Phosphatase: 52 U/L (ref 38–126)
Anion gap: 12 (ref 5–15)
BUN: 20 mg/dL (ref 6–20)
CO2: 26 mmol/L (ref 22–32)
Calcium: 8.6 mg/dL — ABNORMAL LOW (ref 8.9–10.3)
Chloride: 101 mmol/L (ref 98–111)
Creatinine, Ser: 1 mg/dL (ref 0.61–1.24)
GFR calc Af Amer: >60 mL/min (ref >60)
GFR calc non Af Amer: >60 mL/min (ref >60)
Glucose, Bld: 149 mg/dL — ABNORMAL HIGH (ref 70–99)
Potassium: 3.8 mmol/L (ref 3.5–5.1)
Sodium: 139 mmol/L (ref 135–145)
Total Bilirubin: 1.4 mg/dL — ABNORMAL HIGH (ref 0.3–1.2)
Total Protein: 5.8 g/dL — ABNORMAL LOW (ref 6.5–8.1)

## 2018-07-28 LAB — GLUCOSE, CAPILLARY
Glucose-Capillary: 181 mg/dL — ABNORMAL HIGH (ref 70–99)
Glucose-Capillary: 183 mg/dL — ABNORMAL HIGH (ref 70–99)
Glucose-Capillary: 239 mg/dL — ABNORMAL HIGH (ref 70–99)
Glucose-Capillary: 175 mg/dL — ABNORMAL HIGH (ref 70–99)

## 2018-07-28 LAB — FERRITIN: Ferritin: 703 ng/mL — ABNORMAL HIGH (ref 24–336)

## 2018-07-28 LAB — C-REACTIVE PROTEIN: CRP: <0.8 mg/dL (ref <1.0)

## 2018-07-28 MED ORDER — FUROSEMIDE 10 MG/ML IJ SOLN
40.0000 mg | Freq: Once | INTRAMUSCULAR | Status: AC
  Administered 2018-07-28: 13:00:00 40 mg via INTRAVENOUS
  Filled 2018-07-28: qty 4

## 2018-07-28 NOTE — Progress Notes (Signed)
PROGRESS NOTE                                                                                                                                                                                                             Patient Demographics:    Jason Huerta, is a 58 y.o. male, DOB - [birth year], WJX:[id number]  Outpatient Primary MD for the patient is Crissman, Redge Gainer, MD    LOS - 7  No chief complaint on file.      Brief Narrative: Patient is a 58 y.o. male history of HTN, insomnia, obesity who presented with approximately four 5-day history of worsening shortness of breath, cough fatigue and fever.  Was found to have bilateral infiltrates on chest x-ray.  COVID-19 PCR was positive.  He was felt to have acute hypoxic respiratory failure in the setting of COVID 19 viral pneumonia and admitted to the hospitalist service at [facility].  See below for further details.   Subjective:   Ports he is feeling better, but she went up to 4 L nasal cannula overnight.   Assessment  & Plan :   Acute Hypoxic Resp Failure due to Covid 19 Viral pneumonia: -He appears to be improving, he had his oxygen requirement went up to 4 L nasal cannula this morning, I will give him 1 dose of Lasix 40 mg IV once, he was encouraged to prone, as well he was encouraged to use incentive spirometry. - Inflammatory markers are slowly downtrending as well.Continue Remdesivir-patient is s/p steroids, Actemra and plasma.   COVID-19 Labs:  Recent Labs    07/26/18 0448 07/27/18 0403 07/28/18 0400  DDIMER 1.86*  --   --   FERRITIN 870* 756* 703*  LDH 449* 425*  --   CRP 1.6* 1.0* <0.8    Lab Results  Component Value Date   SARSCOV2NAA POSITIVE (A) 07/21/2018     COVID-19 Medications: 5/26>> Remdesivir 5/26>> Solu-Medrol 5/27>> Actemra 5/29>> convalescent plasma  The treatment plan and use of Solumedrol/Actemra/convalescent  plasma-its known side effects were discussed with patient,it was clearly explained that there is no definitive treatment for COVID-19 infection (apart from Remdesivir), use of the above noted medications are based on published clinical articles/anecdotal data which are not peer-reviewed or randomized control trials.  Complete risks and long-term side effects are unknown, however in the best  clinical judgment they seem to have of some clinical benefit which at this time outweighs medical risks given tenuous clinical state of the patient.  Patient agree's with the treatment plan and want to receive the given medications.   ABG:    Component Value Date/Time   PHART 7.500 (H) 07/24/2018 0302   PCO2ART 36.1 07/24/2018 0302   PO2ART 50.0 (L) 07/24/2018 0302   HCO3 28.2 (H) 07/24/2018 0302   TCO2 29 07/24/2018 0302   O2SAT 88.0 07/24/2018 0302    AKI: Resolved-likely hemodynamically mediated.  Follow periodically  Mild transaminitis: Likely secondary COVID 19 infection-continue to follow closely-as patient is on Actemra.  Hypokalemia: Repleted.    Hypertension: Blood pressure controlled without the use of any antihypertensives  Obesity  Condition - Extremely Guarded  Family Communication: D/W patient.  Code Status :  Full Code  Diet : Heart healthy  Disposition Plan  :  Remain inpatient  Consults  : None  Procedures  : None  GI prophylaxis:H2 Blocker  DVT Prophylaxis  :  Lovenox at twice daily dosing given profound hypoxia  Lab Results  Component Value Date   PLT 279 07/28/2018    Inpatient Medications  Scheduled Meds: . aspirin EC  81 mg Oral Daily  . enoxaparin (LOVENOX) injection  40 mg Subcutaneous Q12H  . famotidine  20 mg Oral Daily  . insulin aspart  0-5 Units Subcutaneous QHS  . insulin aspart  0-9 Units Subcutaneous TID WC  . loratadine  10 mg Oral Daily  . mouth rinse  15 mL Mouth Rinse BID  . multivitamin with minerals  1 tablet Oral Daily  . Ensure Max  Protein  11 oz Oral BID  . sodium chloride flush  3 mL Intravenous Q12H  . vitamin C  500 mg Oral Daily  . zinc sulfate  220 mg Oral Daily   Continuous Infusions: . sodium chloride Stopped (07/25/18 2353)   PRN Meds:.sodium chloride, acetaminophen, chlorpheniramine-HYDROcodone, guaiFENesin-dextromethorphan, ondansetron **OR** ondansetron (ZOFRAN) IV, sodium chloride  Antibiotics  :    Anti-infectives (From admission, onward)   Start     Dose/Rate Route Frequency Ordered Stop   07/22/18 2200  remdesivir 100 mg in sodium chloride 0.9 % 230 mL IVPB     100 mg 500 mL/hr over 30 Minutes Intravenous Every 24 hours 07/21/18 2305 07/25/18 2321   07/21/18 2330  remdesivir 200 mg in sodium chloride 0.9 % 210 mL IVPB     200 mg 500 mL/hr over 30 Minutes Intravenous Once 07/21/18 2305 07/22/18 0029        7    Objective:   Vitals:   07/27/18 1825 07/27/18 2110 07/28/18 0327 07/28/18 0800  BP:  108/77 118/70   Pulse: (!) 108  (!) 101   Resp: (!) 24  19   Temp:  98.4 F (36.9 C) 99.2 F (37.3 C) 98.3 F (36.8 C)  TempSrc:  Oral Oral   SpO2: (!) 86%  92% 94%  Weight:      Height:        Wt Readings from Last 3 Encounters:  07/21/18 113.4 kg  07/21/18 113.4 kg  10/31/17 104.3 kg     Intake/Output Summary (Last 24 hours) at 07/28/2018 1325 Last data filed at 07/28/2018 1000 Gross per 24 hour  Intake 120 ml  Output 300 ml  Net -180 ml     Physical Exam  Awake Alert, Oriented X 3, No new F.N deficits, Normal affect Symmetrical Chest wall movement, Good air  movement bilaterally, fine bibasilar Rales. RRR,No Gallops,Rubs or new Murmurs, No Parasternal Heave +ve B.Sounds, Abd Soft, No tenderness, No rebound - guarding or rigidity. No Cyanosis, Clubbing or edema, No new Rash or bruise       Data Review:    CBC Recent Labs  Lab 07/22/18 0311 07/23/18 0405  07/24/18 0500 07/25/18 0353 07/26/18 0448 07/27/18 0403 07/28/18 0400  WBC 6.6 4.2  --  5.7 5.5 4.5 5.0 4.6   HGB 12.7* 13.5   < > 13.1 13.9 13.5 15.1 15.9  HCT 36.4* 39.0   < > 38.4* 40.3 41.0 44.1 46.2  PLT 134* 166  --  210 251 217 269 279  MCV 87.5 86.9  --  87.3 88.0 88.7 88.2 88.8  MCH 30.5 30.1  --  29.8 30.3 29.2 30.2 30.6  MCHC 34.9 34.6  --  34.1 34.5 32.9 34.2 34.4  RDW 13.3 13.1  --  13.1 13.1 13.1 13.1 13.1  LYMPHSABS 0.5* 0.5*  --  0.5* 0.5* 0.9  --   --   MONOABS 0.2 0.3  --  0.3 0.4 0.4  --   --   EOSABS 0.0 0.0  --  0.0 0.0 0.1  --   --   BASOSABS 0.0 0.0  --  0.0 0.0 0.0  --   --    < > = values in this interval not displayed.    Chemistries  Recent Labs  Lab 07/22/18 0311  07/24/18 0500 07/25/18 0353 07/26/18 0448 07/27/18 0403 07/28/18 0400  NA 133*   < > 138 140 141 139 139  K 3.5   < > 3.8 3.8 3.5 3.6 3.8  CL 95*   < > 102 101 100 99 101  CO2 27   < > GLUCOSE 154*   < > 272* 184* 156* 159* 149*  BUN 25*   < > 29* 27* 25* 24* 20  CREATININE 0.96   < > 0.87 0.91 0.89 0.92 1.00  CALCIUM 8.1*   < > 8.3* 8.6* 8.3* 8.4* 8.6*  MG 2.2  --   --   --   --   --   --   AST 105*   < > 48* 70* 116* 80* 61*  ALT 48*   < > 39 59* 94* 94* 78*  ALKPHOS 44   < > 49 53 48 53 52  BILITOT 1.0   < > 0.8 0.6 0.9 1.0 1.4*   < > = values in this interval not displayed.   ------------------------------------------------------------------------------------------------------------------ No results for input(s): CHOL, HDL, LDLCALC, TRIG, CHOLHDL, LDLDIRECT in the last 72 hours.  Lab Results  Component Value Date   HGBA1C 6.5 (H) 07/22/2018   ------------------------------------------------------------------------------------------------------------------ No results for input(s): TSH, T4TOTAL, T3FREE, THYROIDAB in the last 72 hours.  Invalid input(s): FREET3 ------------------------------------------------------------------------------------------------------------------ Recent Labs    07/27/18 0403 07/28/18 0400  FERRITIN 756* 703*    Coagulation profile  Recent Labs  Lab 07/21/18 1357  INR 1.0    Recent Labs    07/26/18 0448  DDIMER 1.86*    Cardiac Enzymes No results for input(s): CKMB, TROPONINI, MYOGLOBIN in the last 168 hours.  Invalid input(s): CK ------------------------------------------------------------------------------------------------------------------    Component Value Date/Time   BNP 27.0 07/21/2018 1357    Micro Results Recent Results (from the past 240 hour(s))  SARS Coronavirus 2 (CEPHEID- Performed in [facility] hospital lab), Hosp Order     Status: Abnormal   Collection Time:  07/21/18  1:56 PM  Result Value Ref Range Status   SARS Coronavirus 2 POSITIVE (A) NEGATIVE Final    Comment: RESULT CALLED TO, READ BACK BY AND VERIFIED WITH: STEPHEN JONES @1538  ON 07/21/2018 BY FMW (NOTE) If result is NEGATIVE SARS-CoV-2 target nucleic acids are NOT DETECTED. The SARS-CoV-2 RNA is generally detectable in upper and lower  respiratory specimens during the acute phase of infection. The lowest  concentration of SARS-CoV-2 viral copies this assay can detect is 250  copies / mL. A negative result does not preclude SARS-CoV-2 infection  and should not be used as the sole basis for treatment or other  patient management decisions.  A negative result may occur with  improper specimen collection / handling, submission of specimen other  than nasopharyngeal swab, presence of viral mutation(s) within the  areas targeted by this assay, and inadequate number of viral copies  (<250 copies / mL). A negative result must be combined with clinical  observations, patient history, and epidemiological information. If result is POSITIVE SARS-CoV-2 target nucleic acids are DETECTE D. The SARS-CoV-2 RNA is generally detectable in upper and lower  respiratory specimens during the acute phase of infection.  Positive  results are indicative of active infection with SARS-CoV-2.  Clinical  correlation with patient history and other  diagnostic information is  necessary to determine patient infection status.  Positive results do  not rule out bacterial infection or co-infection with other viruses. If result is PRESUMPTIVE POSTIVE SARS-CoV-2 nucleic acids MAY BE PRESENT.   A presumptive positive result was obtained on the submitted specimen  and confirmed on repeat testing.  While 2019 novel coronavirus  (SARS-CoV-2) nucleic acids may be present in the submitted sample  additional confirmatory testing may be necessary for epidemiological  and / or clinical management purposes  to differentiate between  SARS-CoV-2 and other Sarbecovirus currently known to infect humans.  If clinically indicated additional testing with an alternate test  methodology (LAB745 3) is advised. The SARS-CoV-2 RNA is generally  detectable in upper and lower respiratory specimens during the acute  phase of infection. The expected result is Negative. Fact Sheet for Patients:  BoilerBrush.com.cy Fact Sheet for Healthcare Providers: https://pope.com/ This test is not yet approved or cleared by the [place] FDA and has been authorized for detection and/or diagnosis of SARS-CoV-2 by FDA under an Emergency Use Authorization (EUA).  This EUA will remain in effect (meaning this test can be used) for the duration of the COVID-19 declaration under Section 564(b)(1) of the Act, 21 U.S.C. section 360bbb-3(b)(1), unless the authorization is terminated or revoked sooner. Performed at [facility], [address] [place], [place] [address]   Blood Culture (routine x 2)     Status: None   Collection Time: 07/21/18  1:57 PM  Result Value Ref Range Status   Specimen Description BLOOD RIGHT ANTECUBITAL  Final   Special Requests   Final    BOTTLES DRAWN AEROBIC AND ANAEROBIC Blood Culture results may not be optimal due to an excessive volume of blood received in culture bottles   Culture   Final     NO GROWTH 5 DAYS Performed at [facility], [address], [place], [place] [address]    Report Status 07/26/2018 FINAL  Final  Blood Culture (routine x 2)     Status: None   Collection Time: 07/21/18  1:57 PM  Result Value Ref Range Status   Specimen Description BLOOD BLOOD LEFT HAND  Final  Special Requests   Final    BOTTLES DRAWN AEROBIC AND ANAEROBIC Blood Culture results may not be optimal due to an excessive volume of blood received in culture bottles   Culture   Final    NO GROWTH 5 DAYS Performed at [facility], [address] [place], [place] [address]    Report Status 07/26/2018 FINAL  Final    Radiology Reports Dg Chest Port 1 View  Result Date: 07/24/2018 CLINICAL DATA:  Shortness of breath. EXAM: PORTABLE CHEST 1 VIEW COMPARISON:  07/22/2018.  07/21/2018. FINDINGS: Heart size normal. Diffuse bilateral interstitial prominence noted is unchanged. An active interstitial process including pneumonitis could present this fashion. No new findings noted. No pleural effusion or pneumothorax. IMPRESSION: Diffuse bilateral pulmonary interstitial prominence is again noted and is unchanged. An active interstitial process including pneumonitis could present in this fashion. Chest is unchanged from prior exams. Electronically Signed   By: Maisie Fushomas  Register   On: 07/24/2018 07:39   Dg Chest Port 1 View  Result Date: 07/22/2018 CLINICAL DATA:  Acute respiratory failure due to COVID-19 EXAM: PORTABLE CHEST 1 VIEW COMPARISON:  Yesterday FINDINGS: Stable generalized interstitial opacity. Normal heart size. No visible effusion or pneumothorax. IMPRESSION: Stable generalized interstitial opacity. Electronically Signed   By: Marnee SpringJonathon  Watts M.D.   On: 07/22/2018 08:05   Dg Chest Portable 1 View  Result Date: 07/21/2018 CLINICAL DATA:  Shortness of breath. EXAM: PORTABLE CHEST 1 VIEW COMPARISON:  No recent. FINDINGS: Mediastinum and hilar structures normal. Heart size  normal. Diffuse bilateral pulmonary interstitial prominence. An active interstitial process including pneumonitis and or interstitial edema could present in this fashion. Tiny left pleural effusion cannot be excluded. No pneumothorax. Degenerative change thoracic spine. IMPRESSION: Diffuse bilateral from interstitial prominence. An active interstitial process including pneumonitis and/or interstitial edema could present this fashion. Tiny left pleural effusion cannot be excluded. Electronically Signed   By: Maisie Fushomas  Register   On: 07/21/2018 14:04   Time Spent in minutes  25    Huey Bienenstockawood  M.D on 07/28/2018 at 1:25 PM  To page go to [email or web address] - use universal password  [facility] -  Office  [phone]   Admit date - 07/21/2018

## 2018-07-28 NOTE — Progress Notes (Signed)
07/28/2018 SATURATION QUALIFICATIONS: (This note is used to comply with regulatory documentation for home oxygen)  Patient Saturations on Room Air at Rest = 94%  Patient Saturations on Room Air while Ambulating = 90%  Patient Saturations on NT Liters of oxygen while Ambulating = NT% (NT due to ambulatory sats were >90% on RA).  Please briefly explain why patient needs home oxygen:  Pt with interesting presentation, his O2 sats were significantly better while ambulating (90-100% on RA during gait) than when he sat down in the chair (after sitting dropped to 84% on RA requiring 4 L O2 [place] to increase back to 90%).  DOE 2/4 during gait with HR increase to 140 from 80 at rest.    Lurena Joiner B. Dewitt Judice, PT, DPT  Acute Rehabilitation [phone] pager [phone]) [phone] office

## 2018-07-28 NOTE — Evaluation (Signed)
Physical Therapy Evaluation Patient Details Name: Jason Huerta MRN: [id number] DOB: 12-Dec-1960 Today's Date: 07/28/2018   History of Present Illness  58 y.o. male admitted on 07/21/18 with SOB, fever, chills, with SPO2 in the 70s in the ED.  Pt dx with acute hypoxic respiratory failure due to COVID-19 infection.  Pt with significant PMH of HTN.    Clinical Impression  Pt with interesting presentation during gait.  He had his BEST O2 saturations while walking at 90-100% on RA during gait, but then desaturated after sitting in the recliner chair to 84% on RA requiring 4 L O2 [place] to get O2 back to 90%.  DOE 2/4 during gait and HR 80-140 bpm.  Good waveform throughout.  PT will continue to follow acutely for safe mobility progression    Follow Up Recommendations No PT follow up    Equipment Recommendations  Other (comment)(possible home O2?)    Recommendations for Other Services    NA    Precautions / Restrictions Precautions Precautions: Other (comment) Precaution Comments: monitor O2 sats Restrictions Weight Bearing Restrictions: No      Mobility  Bed Mobility Overal bed mobility: Independent                Transfers Overall transfer level: Independent                  Ambulation/Gait Ambulation/Gait assistance: Supervision Gait Distance (Feet): 300 Feet Assistive device: None Gait Pattern/deviations: WFL(Within Functional Limits) Gait velocity: decreased Gait velocity interpretation: >2.62 ft/sec, indicative of community ambulatory General Gait Details: slow speed, cautious gait pattern, DOE 3/4, HR 140s, RR 30s, O2 sats on RA during gait 90-100%, interestingly enough he did not desat during gait, but did once seated to 84% on RA, requiring 4 L O2 [place] to return to 90%.           Balance Overall balance assessment: No apparent balance deficits (not formally assessed)                                           Pertinent Vitals/Pain Pain  Assessment: No/denies pain    Home Living Family/patient expects to be discharged to:: Private residence Living Arrangements: Alone               Additional Comments: had a dog, lucy, she is a pitt    Prior Function Level of Independence: Independent         Comments: works as a Solicitor, does a lot of walking at work, reports he got sick from work.      Hand Dominance   Dominant Hand: Right    Extremity/Trunk Assessment   Upper Extremity Assessment Upper Extremity Assessment: Overall WFL for tasks assessed    Lower Extremity Assessment Lower Extremity Assessment: Overall WFL for tasks assessed    Cervical / Trunk Assessment Cervical / Trunk Assessment: Normal  Communication   Communication: No difficulties  Cognition Arousal/Alertness: Awake/alert Behavior During Therapy: WFL for tasks assessed/performed Overall Cognitive Status: Within Functional Limits for tasks assessed                                               Assessment/Plan    PT Assessment Patient needs continued PT services  PT Problem  List Decreased activity tolerance;Decreased knowledge of precautions       PT Treatment Interventions DME instruction;Gait training;Functional mobility training;Therapeutic activities;Therapeutic exercise;Balance training;Patient/family education    PT Goals (Current goals can be found in the Care Plan section)  Acute Rehab PT Goals Patient Stated Goal: to go home as soon as he can PT Goal Formulation: With patient Time For Goal Achievement: 08/11/18 Potential to Achieve Goals: Good    Frequency Min 5X/week           AM-PAC PT "6 Clicks" Mobility  Outcome Measure Help needed turning from your back to your side while in a flat bed without using bedrails?: None Help needed moving from lying on your back to sitting on the side of a flat bed without using bedrails?: None Help needed moving to and from a bed to a chair (including  a wheelchair)?: None Help needed standing up from a chair using your arms (e.g., wheelchair or bedside chair)?: None Help needed to walk in hospital room?: None Help needed climbing 3-5 steps with a railing? : A Little 6 Click Score: 23    End of Session Equipment Utilized During Treatment: Oxygen Activity Tolerance: Patient limited by fatigue Patient left: in bed Nurse Communication: Mobility status;Other (comment)(O2 saturations) PT Visit Diagnosis: Difficulty in walking, not elsewhere classified (R26.2)    Time: 1010-1046 PT Time Calculation (min) (ACUTE ONLY): 36 min   Charges:          Lurena Joinerebecca B. Marshal Eskew, PT, DPT  Acute Rehabilitation [phone] pager #[phone] office   PT Evaluation $PT Eval Low Complexity: 1 Low PT Treatments $Gait Training: 8-22 mins        07/28/2018, 10:56 AM

## 2018-07-28 NOTE — Progress Notes (Signed)
CardioVascular Research Department and AHF Team  ReDS Research Project   Patient #: [id number]  ReDS Measurement  Right: 32 %  Left: 32 %

## 2018-07-29 LAB — C-REACTIVE PROTEIN: CRP: <0.8 mg/dL (ref <1.0)

## 2018-07-29 LAB — COMPREHENSIVE METABOLIC PANEL
ALT: 71 U/L — ABNORMAL HIGH (ref 0–44)
AST: 60 U/L — ABNORMAL HIGH (ref 15–41)
Albumin: 3.3 g/dL — ABNORMAL LOW (ref 3.5–5.0)
Alkaline Phosphatase: 53 U/L (ref 38–126)
Anion gap: 11 (ref 5–15)
BUN: 21 mg/dL — ABNORMAL HIGH (ref 6–20)
CO2: 28 mmol/L (ref 22–32)
Calcium: 8.8 mg/dL — ABNORMAL LOW (ref 8.9–10.3)
Chloride: 99 mmol/L (ref 98–111)
Creatinine, Ser: 0.95 mg/dL (ref 0.61–1.24)
GFR calc Af Amer: >60 mL/min (ref >60)
GFR calc non Af Amer: >60 mL/min (ref >60)
Glucose, Bld: 127 mg/dL — ABNORMAL HIGH (ref 70–99)
Potassium: 3.4 mmol/L — ABNORMAL LOW (ref 3.5–5.1)
Sodium: 138 mmol/L (ref 135–145)
Total Bilirubin: 1.2 mg/dL (ref 0.3–1.2)
Total Protein: 6 g/dL — ABNORMAL LOW (ref 6.5–8.1)

## 2018-07-29 LAB — CBC
HCT: 45.2 % (ref 39.0–52.0)
Hemoglobin: 15.3 g/dL (ref 13.0–17.0)
MCH: 29.8 pg (ref 26.0–34.0)
MCHC: 33.8 g/dL (ref 30.0–36.0)
MCV: 88.1 fL (ref 80.0–100.0)
Platelets: 273 10*3/uL (ref 150–400)
RBC: 5.13 MIL/uL (ref 4.22–5.81)
RDW: 13.2 % (ref 11.5–15.5)
WBC: 4.5 10*3/uL (ref 4.0–10.5)
nRBC: 0 % (ref 0.0–0.2)

## 2018-07-29 LAB — FERRITIN: Ferritin: 727 ng/mL — ABNORMAL HIGH (ref 24–336)

## 2018-07-29 LAB — GLUCOSE, CAPILLARY
Glucose-Capillary: 149 mg/dL — ABNORMAL HIGH (ref 70–99)
Glucose-Capillary: 214 mg/dL — ABNORMAL HIGH (ref 70–99)
Glucose-Capillary: 289 mg/dL — ABNORMAL HIGH (ref 70–99)

## 2018-07-29 MED ORDER — ASCORBIC ACID 500 MG PO TABS: 500.0000 mg | ORAL_TABLET | Freq: Every day | ORAL | Status: AC

## 2018-07-29 MED ORDER — METHYLPREDNISOLONE SODIUM SUCC 125 MG IJ SOLR
80.0000 mg | Freq: Three times a day (TID) | INTRAMUSCULAR | Status: DC
  Administered 2018-07-29: 80 mg via INTRAVENOUS
  Filled 2018-07-29: qty 2

## 2018-07-29 MED ORDER — ZINC SULFATE 220 (50 ZN) MG PO CAPS: 220.0000 mg | ORAL_CAPSULE | Freq: Every day | ORAL | Status: AC

## 2018-07-29 MED ORDER — ACETAMINOPHEN 325 MG PO TABS: 650.0000 mg | ORAL_TABLET | Freq: Four times a day (QID) | ORAL | Status: AC | PRN

## 2018-07-29 NOTE — Consult Note (Signed)
   [facility] CM Inpatient Consult   07/29/2018  Jason Huerta 11/16/1960 [id number]   Patient screened for potential [facility] Care Management services are needed.  Review of patient's medical record reveals patient was admitted for COVID-19 positive viral pneumonia.  Patient's primary care has [facility] Embedded Care Management team.  Patient is [facility] ACO with [employer] [facility].  Primary Care Provider is  [facility], this office provides the post hospital transition of care follow up.  Plan:  Alerted [facility] Embedded RNCM of patient's disposition home. No other needs noted.    For questions contact:   Charlesetta Shanks, RN BSN CCM [facility]  [phone] business mobile phone Toll free office [phone]  Fax number: [phone] [email or web address]

## 2018-07-29 NOTE — Progress Notes (Signed)
Reviewed discharge paperwork with patient and answered all questions. Educated pt regarding self quarantine for 2 weeks. IV removed and pt escorted to front entrance via wheelchair.

## 2018-07-29 NOTE — Progress Notes (Signed)
PT Cancellation Note  Patient Details Name: Jason Huerta MRN: [id number] DOB: 1960/11/30   Cancelled Treatment:    Reason Eval/Treat Not Completed: Other (comment).  Pt due to d/c today.  He reported that the MD used the forehead probe to walk him around the room today on RA and his numbers looked normal (this was my thought after yesterday's session with his interesting presentation).  He has no questions and is ready to get home to "real food" and his dog, [name].    Thanks,  [name]. [name], PT, DPT  Acute Rehabilitation [phone] pager [phone]) [phone] office     [name] 07/29/2018, 12:44 PM

## 2018-07-29 NOTE — Discharge Summary (Signed)
Jason Huerta, is a 58 y.o. male  DOB [birth year]  MRN [id number].  Admission date:  07/21/2018  Admitting Physician  Maretta Bees, MD  Discharge Date:  07/29/2018   Primary MD  Steele Sizer, MD  Recommendations for primary care physician for things to follow:  -Please check CBC, CMP during next visit, follow on LFTs has received Actemra and severe during hospital stay -Patient hemoglobin A1c is 6.5, continue counseling about diet compliance and exercise.   Admission Diagnosis  COVID-19 VIRUS INFECTION   Discharge Diagnosis  COVID-19 VIRUS INFECTION    Principal Problem:   Acute respiratory disease due to COVID-19 virus Active Problems:   Hypertension   Insomnia   LFT elevation   Hyperglycemia   Hypokalemia   Thrombocytopenia (HCC)   AKI (acute kidney injury) (HCC)      Past Medical History:  Diagnosis Date  . Hypertension   . Lichen planus     Past Surgical History:  Procedure Laterality Date  . NO PAST SURGERIES         History of present illness and  Hospital Course:     Kindly see H&P for history of present illness and admission details, please review complete Labs, Consult reports and Test reports for all details in brief  HPI  from the history and physical done on the day of admission 07/21/2018 HPI: Jason Huerta is a 58 y.o. 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 He began having symptoms including cough, fevers, malaise, decreased appetite about 10 days ago, worsening over past 4-5 days associated with increased dyspnea. He'd tested positive for coronavirus previously and this was confirmed at ED. On EMS arrival, SpO2 was in 70%'s, improved on NRB with ongoing tachypnea but improved respiratory effort. Admission at [facility] was  requested.  Review of Systems: +Fevers, chills, poor per oral intake, muscle aches without joint aches or weakness/numbess, and per HPI. All others reviewed and are negative.    Hospital Course   Patient is a 58 y.o. male history of HTN, insomnia, obesity who presented with approximately four 5-day history of worsening shortness of breath, cough fatigue and fever.  Was found to have bilateral infiltrates on chest x-ray.  COVID-19 PCR was positive.  He was felt to have acute hypoxic respiratory failure in the setting of COVID 19 viral pneumonia and admitted to the hospitalist service at [facility].  See below for further details.  Acute Hypoxic Resp Failure due to Covid 19 Viral pneumonia: -He appears to be improving, he is on room air today at the day of discharge, he ambulated in the room with O2 saturation 99-100%. - as well he was encouraged to keep using  incentive spirometry. - Inflammatory markers are slowly downtrending as well. -patient is s/p steroids,Remdesivire,  Actemra and plasma.   COVID-19 Labs:  RecentLabs(last2labs)  Recent Labs    07/26/18 0448 07/27/18 0403 07/28/18 0400  DDIMER 1.86*  --   --   FERRITIN 870* 756* 703*  LDH 449* 425*  --   CRP 1.6* 1.0* <0.8      RecentLabs       Lab Results  Component Value Date   SARSCOV2NAA POSITIVE (A) 07/21/2018       COVID-19 Medications: 5/26>> Remdesivir 5/26>> Solu-Medrol 5/27>> Actemra 5/29>> convalescent plasma  The treatment plan and use of Solumedrol/Actemra/convalescent plasma-its known side effects were discussed with patient,it was clearly explained that there is no definitive treatment for COVID-19 infection (apart from Remdesivir), use of the above noted medications are based on published clinical articles/anecdotal data which are not peer-reviewed or randomized control trials.  Complete risks and long-term side effects are unknown, however in the best clinical judgment  they seem to have of some clinical benefit which at this time outweighs medical risks given tenuous clinical state of the patient.  Patient agree's with the treatment plan and want to receive the given medications.   ABG: Labs(Brief)          Component Value Date/Time   PHART 7.500 (H) 07/24/2018 0302   PCO2ART 36.1 07/24/2018 0302   PO2ART 50.0 (L) 07/24/2018 0302   HCO3 28.2 (H) 07/24/2018 0302   TCO2 29 07/24/2018 0302   O2SAT 88.0 07/24/2018 0302      AKI: Resolved-likely hemodynamically mediated.  Follow periodically  Mild transaminitis: Likely secondary COVID 19 infection-continue to follow closely-as patient is on Actemra, repeat LFTs level as an outpatient.  Hypokalemia: Repleted.    Hypertension: Blood pressure controlled without the use of any antihypertensives, his Norvasc and benazepril has been stopped on discharge.  Obesity  Hyperglycemia - CBG were uncontrolled during hospital stay, A1c came back at 6.5, elevation most like related to steroid, discussed A1c finding with the patient, educated about diet and exercise.   Discharge Condition: stable   Follow UP  Follow-up Information    Steele Sizer, MD Follow up in 2 week(s).   Specialty:  Family Medicine Contact information: [address] [place] [address] [phone]             Discharge Instructions  and  Discharge Medications     Discharge Instructions    Discharge instructions   Complete by:  As directed    Follow with Primary MD Steele Sizer, MD in 10 days   Get CBC, CMP,  checked  by Primary MD next visit.    Activity: As tolerated with Full fall precautions use walker/cane & assistance as needed   Disposition Home    Diet: Heart Healthy , low carb diet with feeding assistance and aspiration precautions.  For Heart failure patients - Check your Weight same time everyday, if you gain over 2 pounds, or you develop in leg swelling, experience more  shortness of breath or chest pain, call your Primary MD immediately. Follow Cardiac Low Salt Diet and 1.5 lit/day fluid restriction.   On your next visit with your primary care physician please Get Medicines reviewed and adjusted.   Please request your Prim.MD to go over all Hospital Tests and Procedure/Radiological results at the follow up, please get all Hospital records sent to your Prim MD by signing hospital release before you go home.   If you experience worsening of your admission symptoms, develop shortness of breath, life threatening emergency, suicidal or homicidal thoughts you must seek medical attention immediately by calling 911  or calling your MD immediately  if symptoms less severe.  You Must read complete instructions/literature along with all the possible adverse reactions/side effects for all the Medicines you take and that have been prescribed to you. Take any new Medicines after you have completely understood and accpet all the possible adverse reactions/side effects.   Do not drive, operating heavy machinery, perform activities at heights, swimming or participation in water activities or provide baby sitting services if your were admitted for syncope or siezures until you have seen by Primary MD or a Neurologist and advised to do so again.  Do not drive when taking Pain medications.    Do not take more than prescribed Pain, Sleep and Anxiety Medications  Special Instructions: If you have smoked or chewed Tobacco  in the last 2 yrs please stop smoking, stop any regular Alcohol  and or any Recreational drug use.  Wear Seat belts while driving.   Please note  You were cared for by a hospitalist during your hospital stay. If you have any questions about your discharge medications or the care you received while you were in the hospital after you are discharged, you can call the unit and asked to speak with the hospitalist on call if the hospitalist that took care of you is  not available. Once you are discharged, your primary care physician will handle any further medical issues. Please note that NO REFILLS for any discharge medications will be authorized once you are discharged, as it is imperative that you return to your primary care physician (or establish a relationship with a primary care physician if you do not have one) for your aftercare needs so that they can reassess your need for medications and monitor your lab values.   Increase activity slowly   Complete by:  As directed      Allergies as of 07/29/2018   No Known Allergies     Medication List    STOP taking these medications   amLODipine 5 MG tablet Commonly known as:  NORVASC   benazepril 40 MG tablet Commonly known as:  LOTENSIN     TAKE these medications   acetaminophen 325 MG tablet Commonly known as:  TYLENOL Take 2 tablets (650 mg total) by mouth every 6 (six) hours as needed for mild pain or headache (fever >/= 101).   ascorbic acid 500 MG tablet Commonly known as:  VITAMIN C Take 1 tablet (500 mg total) by mouth daily. Take for 10 days Start taking on:  July 30, 2018   traZODone 100 MG tablet Commonly known as:  DESYREL Take 1 tablet (100 mg total) by mouth at bedtime.   zinc sulfate 220 (50 Zn) MG capsule Take 1 capsule (220 mg total) by mouth daily. Take for 10 days Start taking on:  July 30, 2018         Diet and Activity recommendation: See Discharge Instructions above   Consults obtained - none   Major procedures and Radiology Reports - PLEASE review detailed and final reports for all details, in brief -      Dg Chest Port 1 View  Result Date: 07/28/2018 CLINICAL DATA:  Hypoxia EXAM: PORTABLE CHEST 1 VIEW COMPARISON:  Jul 24, 2018 and Jul 21, 2018. FINDINGS: There is diffuse interstitial prominence, likely due to fibrosis. There is no appreciable consolidation or airspace opacity. Heart is upper normal in size with pulmonary vascularity normal. No adenopathy.  There is aortic atherosclerosis. No bone lesions. IMPRESSION: Diffuse interstitial prominence,  likely due to fibrosis, stable compared to recent prior studies. A degree of interstitial pneumonitis of infectious etiology cannot be excluded. No airspace consolidation evident. No adenopathy evident. Stable cardiac silhouette. Aortic Atherosclerosis (ICD10-I70.0). Electronically Signed   By: Bretta Bang III M.D.   On: 07/28/2018 13:40   Dg Chest Port 1 View  Result Date: 07/24/2018 CLINICAL DATA:  Shortness of breath. EXAM: PORTABLE CHEST 1 VIEW COMPARISON:  07/22/2018.  07/21/2018. FINDINGS: Heart size normal. Diffuse bilateral interstitial prominence noted is unchanged. An active interstitial process including pneumonitis could present this fashion. No new findings noted. No pleural effusion or pneumothorax. IMPRESSION: Diffuse bilateral pulmonary interstitial prominence is again noted and is unchanged. An active interstitial process including pneumonitis could present in this fashion. Chest is unchanged from prior exams. Electronically Signed   By: Maisie Fus  Register   On: 07/24/2018 07:39   Dg Chest Port 1 View  Result Date: 07/22/2018 CLINICAL DATA:  Acute respiratory failure due to COVID-19 EXAM: PORTABLE CHEST 1 VIEW COMPARISON:  Yesterday FINDINGS: Stable generalized interstitial opacity. Normal heart size. No visible effusion or pneumothorax. IMPRESSION: Stable generalized interstitial opacity. Electronically Signed   By: Marnee Spring M.D.   On: 07/22/2018 08:05   Dg Chest Portable 1 View  Result Date: 07/21/2018 CLINICAL DATA:  Shortness of breath. EXAM: PORTABLE CHEST 1 VIEW COMPARISON:  No recent. FINDINGS: Mediastinum and hilar structures normal. Heart size normal. Diffuse bilateral pulmonary interstitial prominence. An active interstitial process including pneumonitis and or interstitial edema could present in this fashion. Tiny left pleural effusion cannot be excluded. No pneumothorax.  Degenerative change thoracic spine. IMPRESSION: Diffuse bilateral from interstitial prominence. An active interstitial process including pneumonitis and/or interstitial edema could present this fashion. Tiny left pleural effusion cannot be excluded. Electronically Signed   By: Maisie Fus  Register   On: 07/21/2018 14:04    Micro Results    Recent Results (from the past 240 hour(s))  SARS Coronavirus 2 (CEPHEID- Performed in [facility] hospital lab), Hosp Order     Status: Abnormal   Collection Time: 07/21/18  1:56 PM  Result Value Ref Range Status   SARS Coronavirus 2 POSITIVE (A) NEGATIVE Final    Comment: RESULT CALLED TO, READ BACK BY AND VERIFIED WITH: STEPHEN JONES  ON 07/21/2018 BY FMW (NOTE) If result is NEGATIVE SARS-CoV-2 target nucleic acids are NOT DETECTED. The SARS-CoV-2 RNA is generally detectable in upper and lower  respiratory specimens during the acute phase of infection. The lowest  concentration of SARS-CoV-2 viral copies this assay can detect is 250  copies / mL. A negative result does not preclude SARS-CoV-2 infection  and should not be used as the sole basis for treatment or other  patient management decisions.  A negative result may occur with  improper specimen collection / handling, submission of specimen other  than nasopharyngeal swab, presence of viral mutation(s) within the  areas targeted by this assay, and inadequate number of viral copies  (<250 copies / mL). A negative result must be combined with clinical  observations, patient history, and epidemiological information. If result is POSITIVE SARS-CoV-2 target nucleic acids are DETECTE D. The SARS-CoV-2 RNA is generally detectable in upper and lower  respiratory specimens during the acute phase of infection.  Positive  results are indicative of active infection with SARS-CoV-2.  Clinical  correlation with patient history and other diagnostic information is  necessary to determine patient infection  status.  Positive results do  not rule out bacterial infection or co-infection  with other viruses. If result is PRESUMPTIVE POSTIVE SARS-CoV-2 nucleic acids MAY BE PRESENT.   A presumptive positive result was obtained on the submitted specimen  and confirmed on repeat testing.  While 2019 novel coronavirus  (SARS-CoV-2) nucleic acids may be present in the submitted sample  additional confirmatory testing may be necessary for epidemiological  and / or clinical management purposes  to differentiate between  SARS-CoV-2 and other Sarbecovirus currently known to infect humans.  If clinically indicated additional testing with an alternate test  methodology (LAB745 3) is advised. The SARS-CoV-2 RNA is generally  detectable in upper and lower respiratory specimens during the acute  phase of infection. The expected result is Negative. Fact Sheet for Patients:  BoilerBrush.com.cyhttps://www.fda.gov/media/136312/download Fact Sheet for Healthcare Providers: https://pope.com/https://www.fda.gov/media/136313/download This test is not yet approved or cleared by the [place] FDA and has been authorized for detection and/or diagnosis of SARS-CoV-2 by FDA under an Emergency Use Authorization (EUA).  This EUA will remain in effect (meaning this test can be used) for the duration of the COVID-19 declaration under Section 564(b)(1) of the Act, 21 U.S.C. section 360bbb-3(b)(1), unless the authorization is terminated or revoked sooner. Performed at [facility], [address] [place], [place] [address]   Blood Culture (routine x 2)     Status: None   Collection Time: 07/21/18  1:57 PM  Result Value Ref Range Status   Specimen Description BLOOD RIGHT ANTECUBITAL  Final   Special Requests   Final    BOTTLES DRAWN AEROBIC AND ANAEROBIC Blood Culture results may not be optimal due to an excessive volume of blood received in culture bottles   Culture   Final    NO GROWTH 5 DAYS Performed at [facility], [address] [place], [place] [address]    Report Status 07/26/2018 FINAL  Final  Blood Culture (routine x 2)     Status: None   Collection Time: 07/21/18  1:57 PM  Result Value Ref Range Status   Specimen Description BLOOD BLOOD LEFT HAND  Final   Special Requests   Final    BOTTLES DRAWN AEROBIC AND ANAEROBIC Blood Culture results may not be optimal due to an excessive volume of blood received in culture bottles   Culture   Final    NO GROWTH 5 DAYS Performed at [facility], [address] [place], [place] [address]    Report Status 07/26/2018 FINAL  Final       Today   Subjective:   Gerre Pebblesony Vos today has no headache,no chest abdominal pain,no new weakness tingling or numbness, feels much better wants to go home today.   Objective:   Blood pressure 93/72, pulse 83, temperature 97.7 F (36.5 C), resp. rate 12, height 5\' 11"  (1.803 m), weight 113.4 kg, SpO2 94 %.   Intake/Output Summary (Last 24 hours) at 07/29/2018 1151 Last data filed at 07/29/2018 0400 Gross per 24 hour  Intake 1080 ml  Output 1350 ml  Net -270 ml    Exam Awake Alert, Oriented x 3, No new F.N deficits, Normal affect Symmetrical Chest wall movement, Good air movement bilaterally, CTAB RRR,No Gallops,Rubs or new Murmurs, No Parasternal Heave +ve B.Sounds, Abd Soft, Non tender, No rebound -guarding or rigidity. No Cyanosis, Clubbing or edema, No new Rash or bruise  Data Review   CBC w Diff:  Lab Results  Component Value Date   WBC 4.5 07/29/2018   HGB 15.3 07/29/2018   HGB 14.4 03/25/2017   HCT  45.2 07/29/2018   HCT 41.0 03/25/2017   PLT 273 07/29/2018   PLT 214 03/25/2017   LYMPHOPCT 21 07/26/2018   MONOPCT 8 07/26/2018   EOSPCT 2 07/26/2018   BASOPCT 0 07/26/2018    CMP:  Lab Results  Component Value Date   NA 138 07/29/2018   NA 142 03/25/2017   K 3.4 (L) 07/29/2018   CL 99 07/29/2018   CO2 28 07/29/2018   BUN 21 (H) 07/29/2018   BUN 8 03/25/2017   CREATININE 0.95  07/29/2018   PROT 6.0 (L) 07/29/2018   PROT 7.1 03/25/2017   ALBUMIN 3.3 (L) 07/29/2018   ALBUMIN 4.7 03/25/2017   BILITOT 1.2 07/29/2018   BILITOT 0.7 03/25/2017   ALKPHOS 53 07/29/2018   AST 60 (H) 07/29/2018   ALT 71 (H) 07/29/2018  .   Total Time in preparing paper work, data evaluation and todays exam - 35 minutes  Huey Bienenstock M.D on 07/29/2018 at 11:51 AM  [facility]   Office  [phone]

## 2018-07-29 NOTE — Progress Notes (Signed)
[facility] and AHF Team  ReDS Research Project   Patient #: [id number]  ReDS Measurement  Right: 35%  Left: 32%

## 2018-07-29 NOTE — Discharge Instructions (Signed)
Person Under Monitoring Name: Jason Huerta  Location: [address] [place] [id number]   Infection Prevention Recommendations for Individuals Confirmed to have, or Being Evaluated for, 2019 Novel Coronavirus (COVID-19) Infection Who Receive Care at Home  Individuals who are confirmed to have, or are being evaluated for, COVID-19 should follow the prevention steps below until a healthcare provider or local or state health department says they can return to normal activities.  Stay home except to get medical care You should restrict activities outside your home, except for getting medical care. Do not go to work, school, or public areas, and do not use public transportation or taxis.  Call ahead before visiting your doctor Before your medical appointment, call the healthcare provider and tell them that you have, or are being evaluated for, COVID-19 infection. This will help the healthcare providers office take steps to keep other people from getting infected. Ask your healthcare provider to call the local or state health department.  Monitor your symptoms Seek prompt medical attention if your illness is worsening (e.g., difficulty breathing). Before going to your medical appointment, call the healthcare provider and tell them that you have, or are being evaluated for, COVID-19 infection. Ask your healthcare provider to call the local or state health department.  Wear a facemask You should wear a facemask that covers your nose and mouth when you are in the same room with other people and when you visit a healthcare provider. People who live with or visit you should also wear a facemask while they are in the same room with you.  Separate yourself from other people in your home As much as possible, you should stay in a different room from other people in your home. Also, you should use a separate bathroom, if available.  Avoid sharing household items You should not share  dishes, drinking glasses, cups, eating utensils, towels, bedding, or other items with other people in your home. After using these items, you should wash them thoroughly with soap and water.  Cover your coughs and sneezes Cover your mouth and nose with a tissue when you cough or sneeze, or you can cough or sneeze into your sleeve. Throw used tissues in a lined trash can, and immediately wash your hands with soap and water for at least 20 seconds or use an alcohol-based hand rub.  Wash your [employer] Corporationhands Wash your hands often and thoroughly with soap and water for at least 20 seconds. You can use an alcohol-based hand sanitizer if soap and water are not available and if your hands are not visibly dirty. Avoid touching your eyes, nose, and mouth with unwashed hands.   Prevention Steps for Caregivers and Household Members of Individuals Confirmed to have, or Being Evaluated for, COVID-19 Infection Being Cared for in the Home  If you live with, or provide care at home for, a person confirmed to have, or being evaluated for, COVID-19 infection please follow these guidelines to prevent infection:  Follow healthcare providers instructions Make sure that you understand and can help the patient follow any healthcare provider instructions for all care.  Provide for the patients basic needs You should help the patient with basic needs in the home and provide support for getting groceries, prescriptions, and other personal needs.  Monitor the patients symptoms If they are getting sicker, call his or her medical provider and tell them that the patient has, or is being evaluated for, COVID-19 infection. This will help the healthcare providers office  take steps to keep other people from getting infected. Ask the healthcare provider to call the local or state health department.  Limit the number of people who have contact with the patient  If possible, have only one caregiver for the patient.  Other  household members should stay in another home or place of residence. If this is not possible, they should stay  in another room, or be separated from the patient as much as possible. Use a separate bathroom, if available.  Restrict visitors who do not have an essential need to be in the home.  Keep older adults, very young children, and other sick people away from the patient Keep older adults, very young children, and those who have compromised immune systems or chronic health conditions away from the patient. This includes people with chronic heart, lung, or kidney conditions, diabetes, and cancer.  Ensure good ventilation Make sure that shared spaces in the home have good air flow, such as from an air conditioner or an opened window, weather permitting.  Wash your hands often  Wash your hands often and thoroughly with soap and water for at least 20 seconds. You can use an alcohol based hand sanitizer if soap and water are not available and if your hands are not visibly dirty.  Avoid touching your eyes, nose, and mouth with unwashed hands.  Use disposable paper towels to dry your hands. If not available, use dedicated cloth towels and replace them when they become wet.  Wear a facemask and gloves  Wear a disposable facemask at all times in the room and gloves when you touch or have contact with the patients blood, body fluids, and/or secretions or excretions, such as sweat, saliva, sputum, nasal mucus, vomit, urine, or feces.  Ensure the mask fits over your nose and mouth tightly, and do not touch it during use.  Throw out disposable facemasks and gloves after using them. Do not reuse.  Wash your hands immediately after removing your facemask and gloves.  If your personal clothing becomes contaminated, carefully remove clothing and launder. Wash your hands after handling contaminated clothing.  Place all used disposable facemasks, gloves, and other waste in a lined container before  disposing them with other household waste.  Remove gloves and wash your hands immediately after handling these items.  Do not share dishes, glasses, or other household items with the patient  Avoid sharing household items. You should not share dishes, drinking glasses, cups, eating utensils, towels, bedding, or other items with a patient who is confirmed to have, or being evaluated for, COVID-19 infection.  After the person uses these items, you should wash them thoroughly with soap and water.  Wash laundry thoroughly  Immediately remove and wash clothes or bedding that have blood, body fluids, and/or secretions or excretions, such as sweat, saliva, sputum, nasal mucus, vomit, urine, or feces, on them.  Wear gloves when handling laundry from the patient.  Read and follow directions on labels of laundry or clothing items and detergent. In general, wash and dry with the warmest temperatures recommended on the label.  Clean all areas the individual has used often  Clean all touchable surfaces, such as counters, tabletops, doorknobs, bathroom fixtures, toilets, phones, keyboards, tablets, and bedside tables, every day. Also, clean any surfaces that may have blood, body fluids, and/or secretions or excretions on them.  Wear gloves when cleaning surfaces the patient has come in contact with.  Use a diluted bleach solution (e.g., dilute bleach with 1 part  bleach and 10 parts water) or a household disinfectant with a label that says EPA-registered for coronaviruses. To make a bleach solution at home, add 1 tablespoon of bleach to 1 quart (4 cups) of water. For a larger supply, add  cup of bleach to 1 gallon (16 cups) of water.  Read labels of cleaning products and follow recommendations provided on product labels. Labels contain instructions for safe and effective use of the cleaning product including precautions you should take when applying the product, such as wearing gloves or eye protection  and making sure you have good ventilation during use of the product.  Remove gloves and wash hands immediately after cleaning.  Monitor yourself for signs and symptoms of illness Caregivers and household members are considered close contacts, should monitor their health, and will be asked to limit movement outside of the home to the extent possible. Follow the monitoring steps for close contacts listed on the symptom monitoring form.   ? If you have additional questions, contact your local health department or call the epidemiologist on call at [phone] (available 24/7). ? This guidance is subject to change. For the most up-to-date guidance from [facility], please refer to their website: [email or web address].hu  Ascorbic Acid, Vitamin C tablet What is this medicine? ASCORBIC ACID (a SKOR bik AS id) is a naturally occurring form of vitamin C. It is used to treat or prevent low levels of vitamin C and to treat scurvy. This medicine may be used for other purposes; ask your health care provider or pharmacist if you have questions. COMMON BRAND NAME(S): Acerola C What should I tell my health care provider before I take this medicine? They need to know if you have any of the following conditions: -anemia -diabetes -glucose-6-phosphate dehydrogenase (G6PD) deficiency -kidney stones -low sodium diet -an unusual or allergic reaction to ascorbic acid, tartrazine, other medicines, foods, dyes, or preservatives -pregnant or trying to get pregnant -breast-feeding How should I use this medicine? Take this medicine by mouth with a glass of water. Follow the directions on the package or prescription label. You may take this medicine with or without food. If it upsets your stomach take it with food. Take your medicine at regular intervals. Do not take your medicine more often than directed. Talk to your pediatrician regarding the use of this medicine in  children. While this drug may be prescribed for selected conditions, precautions do apply. Overdosage: If you think you have taken too much of this medicine contact a poison control center or emergency room at once. NOTE: This medicine is only for you. Do not share this medicine with others. What if I miss a dose? If you miss a dose, take it as soon as you can. If it is almost time for your next dose, take only that dose. Do not take double or extra doses. What may interact with this medicine? -deferoxamine -iron supplements This list may not describe all possible interactions. Give your health care provider a list of all the medicines, herbs, non-prescription drugs, or dietary supplements you use. Also tell them if you smoke, drink alcohol, or use illegal drugs. Some items may interact with your medicine. What should I watch for while using this medicine? Follow a good diet. Taking a vitamin supplement does not replace the need for a balanced diet. Some foods that have vitamin C naturally are citrus fruits, green peppers, broccoli, cabbage, and tomatoes. If you are diabetic very high doses of ascorbic acid can interfere with tests for sugar  in the urine. Talk to your doctor or heath care professional if you check your urine glucose levels. What side effects may I notice from receiving this medicine? Side effects that you should report to your doctor or health care professional as soon as possible: -allergic reactions like skin rash, itching or hives, swelling of the face, lips, or tongue -breathing problems -diarrhea with headache or nausea -flushing or redness of skin -pain in lower back, side, or stomach Side effects that usually do not require medical attention (report to your doctor or health care professional if they continue or are bothersome): -bad taste in the mouth -stomach upset This list may not describe all possible side effects. Call your doctor for medical advice about side  effects. You may report side effects to FDA at 1-800-FDA-1088. Where should I keep my medicine? Keep out of the reach of children. Store at room temperature between 15 and 30 degrees C (59 and 86 degrees F) or as directed on the package label. Protect from heat and moisture. Throw away any unused medicine after the expiration date. NOTE: This sheet is a summary. It may not cover all possible information. If you have questions about this medicine, talk to your doctor, pharmacist, or health care provider.  2019 Elsevier/Gold Standard (2016-06-10 12:01:36)

## 2018-07-29 NOTE — Progress Notes (Signed)
Nutrition Follow-up  DOCUMENTATION CODES:   Obesity unspecified  INTERVENTION:   Encourage intake of meals.  D/C Ensure Max, patient is refusing.  NUTRITION DIAGNOSIS:   Increased nutrient needs related to acute illness(COVID) as evidenced by estimated needs.  Ongoing  GOAL:   Patient will meet greater than or equal to 90% of their needs  Progressing  MONITOR:   PO intake, Supplement acceptance, Labs, Skin  ASSESSMENT:   58 yo male with PMH of HTN and Lichen planus who was admitted with SOB. COVID-19 positive.  Attempted to call patient, but there was no answer. Spoke with RN who reports patient does not like the hospital food. He may be discharging home today and says he will eat at home. He is also refusing the Ensure supplements, will d/c.   Labs reviewed. Potassium 3.4 (L) CBG's: 149 this morning  Medications reviewed and include vitamin C, zinc, Lantus, Novolog, Solu-medrol, MVI.  Diet Order:   Diet Order            Diet Heart Room service appropriate? Yes; Fluid consistency: Thin  Diet effective now              EDUCATION NEEDS:   No education needs have been identified at this time  Skin:  Skin Assessment: Reviewed RN Assessment  Last BM:  5/30  Height:   Ht Readings from Last 1 Encounters:  07/21/18 5\' 11"  (1.803 m)    Weight:   Wt Readings from Last 1 Encounters:  07/21/18 113.4 kg    Ideal Body Weight:  78.2 kg  BMI:  Body mass index is 34.87 kg/m.  Estimated Nutritional Needs:   Kcal:  2050-2300  Protein:  115-130 gm  Fluid:  2.2 L    Joaquin Courts, RD, LDN, CNSC Pager [phone] After Hours Pager [phone]

## 2018-08-04 ENCOUNTER — Encounter: Payer: Self-pay | Admitting: Family Medicine

## 2018-08-04 ENCOUNTER — Other Ambulatory Visit: Payer: Self-pay

## 2018-08-04 ENCOUNTER — Ambulatory Visit (INDEPENDENT_AMBULATORY_CARE_PROVIDER_SITE_OTHER): Payer: BC Managed Care – PPO | Admitting: Family Medicine

## 2018-08-04 DIAGNOSIS — U071 COVID-19: Secondary | ICD-10-CM

## 2018-08-04 DIAGNOSIS — J069 Acute upper respiratory infection, unspecified: Secondary | ICD-10-CM | POA: Diagnosis not present

## 2018-08-04 MED ORDER — FLUTICASONE-SALMETEROL 100-50 MCG/DOSE IN AEPB: 1.0000 | INHALATION_SPRAY | Freq: Two times a day (BID) | RESPIRATORY_TRACT | 3 refills | Status: DC

## 2018-08-04 NOTE — Progress Notes (Signed)
There were no vitals taken for this visit.   Subjective:    Patient ID: Jason Huerta, male    DOB: [birth year], 58 y.o.   MRN: [id number]  HPI: Jason Huerta is a 58 y.o. male  hosp f/u  Telemedicine using audio/video telecommunications for a synchronous communication visit. Today's visit due to COVID-19 isolation precautions I connected with and verified that I am speaking with the correct person using two identifiers.   I discussed the limitations, risks, security and privacy concerns of performing an evaluation and management service by telecommunication and the availability of in person appointments. I also discussed with the patient that there may be a patient responsible charge related to this service. The patient expressed understanding and agreed to proceed. The patient's location is home. I am at home. Patient now at home thought he was going to have to go on a respirator while in the hospital for COVID-19 at [facility].  Patient now home and slowly recovering.  Patient with marked shortness of breath still coughing using incentive spirometry no fever chills. On chart review CBC and CMP were recommended to be done this week.  Patient's ferritin was also markedly elevated will recheck that possibly elevated because of inflammatory changes from medications. Patient with continued marked fatigue but is starting to move some and do a little bit better. Reviewed nerves anxiety patient is really bored but otherwise denies depression or marked anxiety changes.  Does not want or feels he needs medication to help with depression or nerves.   Transition of Care Hospital Follow up.   Hospital/Facility:[facility]  D/C Physician: Elgergawy D/C Date: 07-29-18  Records Requested: na Records Received: na Records Reviewed: today  Diagnoses on Discharge:   Date of interactive Contact within 48 hours of discharge: 07-29-18 Contact was through: phone  Date of 7 day or 14 day  face-to-face visit:    08-04-18  Outpatient Encounter Medications as of 08/04/2018  Medication Sig  . acetaminophen (TYLENOL) 325 MG tablet Take 2 tablets (650 mg total) by mouth every 6 (six) hours as needed for mild pain or headache (fever >/= 101).  . Fluticasone-Salmeterol (ADVAIR) 100-50 MCG/DOSE AEPB Inhale 1 puff into the lungs 2 (two) times daily.  . traZODone (DESYREL) 100 MG tablet Take 1 tablet (100 mg total) by mouth at bedtime.  . vitamin C (VITAMIN C) 500 MG tablet Take 1 tablet (500 mg total) by mouth daily. Take for 10 days  . zinc sulfate 220 (50 Zn) MG capsule Take 1 capsule (220 mg total) by mouth daily. Take for 10 days   No facility-administered encounter medications on file as of 08/04/2018.     Diagnostic Tests Reviewed/Disposition: done  Consults:reviewed  Discharge Instructions reviewed  Disease/illness Education:done  Home Health/Community Services Discussions/Referrals:na  Establishment or re-establishment of referral orders for community resources:na  Discussion with other health care providers:na  Assessment and Support of treatment regimen adherence:na  Appointments Coordinated with: na  Education for self-management, independent living, and ADLs: done Relevant past medical, surgical, family and social history reviewed and updated as indicated. Interim medical history since our last visit reviewed. Allergies and medications reviewed and updated.  Review of Systems  Constitutional: Positive for fatigue. Negative for chills, diaphoresis and fever.  Respiratory: Positive for shortness of breath. Negative for cough, choking and wheezing.   Cardiovascular: Negative.     Per HPI unless specifically indicated above     Objective:    There were no vitals taken for  this visit.  Wt Readings from Last 3 Encounters:  07/21/18 250 lb (113.4 kg)  07/21/18 250 lb (113.4 kg)  10/31/17 230 lb (104.3 kg)    Physical Exam      Assessment & Plan:    Problem List Items Addressed This Visit      Respiratory   Acute respiratory disease due to COVID-19 virus    Patient recovering from COVID-19. Extensive discussion done with patient on recovery process back to work. Discussed breathing and coughing will give Advair Diskus to see if that helps. Discussed checking labs.  With CBC CMP and ferritin will do so in a safe manner.        I discussed the assessment and treatment plan with the patient. The patient was provided an opportunity to ask questions and all were answered. The patient agreed with the plan and demonstrated an understanding of the instructions.   The patient was advised to call back or seek an in-person evaluation if the symptoms worsen or if the condition fails to improve as anticipated.   I provided 21+ minutes of time during this encounter.  Follow up plan: Return in about 2 weeks (around 08/18/2018), or if symptoms worsen or fail to improve. Do labs in 1 week

## 2018-08-04 NOTE — Assessment & Plan Note (Signed)
Patient recovering from COVID-19. Extensive discussion done with patient on recovery process back to work. Discussed breathing and coughing will give Advair Diskus to see if that helps. Discussed checking labs.  With CBC CMP and ferritin will do so in a safe manner.

## 2018-08-10 ENCOUNTER — Encounter: Payer: Self-pay | Admitting: Family Medicine

## 2018-08-11 ENCOUNTER — Other Ambulatory Visit: Payer: Self-pay | Admitting: Family Medicine

## 2018-08-11 DIAGNOSIS — R7989 Other specified abnormal findings of blood chemistry: Secondary | ICD-10-CM

## 2018-08-11 DIAGNOSIS — J069 Acute upper respiratory infection, unspecified: Secondary | ICD-10-CM

## 2018-08-11 DIAGNOSIS — D696 Thrombocytopenia, unspecified: Secondary | ICD-10-CM

## 2018-08-11 DIAGNOSIS — N179 Acute kidney failure, unspecified: Secondary | ICD-10-CM

## 2018-08-11 NOTE — Progress Notes (Signed)
Patient with declining respiratory function and is status post COVID-19.  Will refer to pulmonary to further evaluate.    Hi Mercy Leppla! My breathing is still not good. I've been using the incentive spirometer. After I came home from the hospital, I was able to float the little blue Puck in the spirometer up around 1249ml and hold it there for 5 or 10 seconds. Now it's a struggle to get it up around 792ml and I can't hold it there for any amount of time. A friend suggested it might be a good idea to possibly see a pulmonologist. What are your thoughts on that subject?  Also, I don't know what day you guys are planning for me to come by and do the blood work but I do know that I will not be able to come on Thursday. I have a repairman coming by the house to fix a leaking gutter downspout and I'm not really sure when he is coming.  Thanks in advance for your response,

## 2018-08-12 ENCOUNTER — Telehealth: Payer: Self-pay

## 2018-08-12 DIAGNOSIS — R0600 Dyspnea, unspecified: Secondary | ICD-10-CM

## 2018-08-12 NOTE — Telephone Encounter (Signed)
Received a urgent referral for pt to be seen for Acute respiratory disease due to COVID-19 virus. Pt is still having shortness of breathe after being released from hospital, denies any other symptoms. How do we want to move forward with scheduling- Office, phone, virtual?

## 2018-08-12 NOTE — Telephone Encounter (Signed)
Spoke to Dr. Alva Garnet, okay to proceed with Office apt. Chest x-ray before apt.   Spoke to patient, scheduled for tomorrow.   Nothing further needed at this time.

## 2018-08-13 ENCOUNTER — Ambulatory Visit
Admission: RE | Admit: 2018-08-13 | Discharge: 2018-08-13 | Disposition: A | Discharge status: Home / Self Care | Payer: BC Managed Care – PPO | Source: Ambulatory Visit | Attending: Pulmonary Disease | Admitting: Pulmonary Disease

## 2018-08-13 ENCOUNTER — Telehealth: Payer: Self-pay | Admitting: Pulmonary Disease

## 2018-08-13 ENCOUNTER — Other Ambulatory Visit: Payer: Self-pay

## 2018-08-13 ENCOUNTER — Emergency Department: Payer: BC Managed Care – PPO

## 2018-08-13 ENCOUNTER — Observation Stay
Admission: EM | Admit: 2018-08-13 | Discharge: 2018-08-15 | Disposition: A | Discharge status: Home / Self Care | Payer: BC Managed Care – PPO | Attending: Internal Medicine | Admitting: Internal Medicine

## 2018-08-13 ENCOUNTER — Other Ambulatory Visit
Admission: RE | Admit: 2018-08-13 | Discharge: 2018-08-13 | Disposition: A | Discharge status: Home / Self Care | Payer: BC Managed Care – PPO | Source: Ambulatory Visit | Attending: Pulmonary Disease | Admitting: Pulmonary Disease

## 2018-08-13 ENCOUNTER — Encounter: Payer: Self-pay | Admitting: Emergency Medicine

## 2018-08-13 ENCOUNTER — Ambulatory Visit: Payer: BC Managed Care – PPO | Admitting: Pulmonary Disease

## 2018-08-13 ENCOUNTER — Encounter: Payer: Self-pay | Admitting: Pulmonary Disease

## 2018-08-13 VITALS — BP 132/78 | HR 113 | Temp 97.9°F | Ht 71.0 in | Wt 235.8 lb

## 2018-08-13 DIAGNOSIS — U071 COVID-19: Secondary | ICD-10-CM

## 2018-08-13 DIAGNOSIS — I10 Essential (primary) hypertension: Secondary | ICD-10-CM | POA: Insufficient documentation

## 2018-08-13 DIAGNOSIS — R918 Other nonspecific abnormal finding of lung field: Secondary | ICD-10-CM

## 2018-08-13 DIAGNOSIS — R0602 Shortness of breath: Secondary | ICD-10-CM

## 2018-08-13 DIAGNOSIS — R0600 Dyspnea, unspecified: Secondary | ICD-10-CM | POA: Insufficient documentation

## 2018-08-13 DIAGNOSIS — B948 Sequelae of other specified infectious and parasitic diseases: Secondary | ICD-10-CM | POA: Insufficient documentation

## 2018-08-13 DIAGNOSIS — R7989 Other specified abnormal findings of blood chemistry: Secondary | ICD-10-CM

## 2018-08-13 DIAGNOSIS — J439 Emphysema, unspecified: Secondary | ICD-10-CM | POA: Insufficient documentation

## 2018-08-13 DIAGNOSIS — Z79899 Other long term (current) drug therapy: Secondary | ICD-10-CM | POA: Diagnosis not present

## 2018-08-13 DIAGNOSIS — Z87891 Personal history of nicotine dependence: Secondary | ICD-10-CM | POA: Insufficient documentation

## 2018-08-13 DIAGNOSIS — J9601 Acute respiratory failure with hypoxia: Secondary | ICD-10-CM | POA: Diagnosis not present

## 2018-08-13 DIAGNOSIS — J988 Other specified respiratory disorders: Secondary | ICD-10-CM

## 2018-08-13 DIAGNOSIS — J189 Pneumonia, unspecified organism: Secondary | ICD-10-CM

## 2018-08-13 DIAGNOSIS — Z8619 Personal history of other infectious and parasitic diseases: Secondary | ICD-10-CM | POA: Insufficient documentation

## 2018-08-13 DIAGNOSIS — D696 Thrombocytopenia, unspecified: Secondary | ICD-10-CM

## 2018-08-13 DIAGNOSIS — I2510 Atherosclerotic heart disease of native coronary artery without angina pectoris: Secondary | ICD-10-CM | POA: Diagnosis not present

## 2018-08-13 DIAGNOSIS — Z9981 Dependence on supplemental oxygen: Secondary | ICD-10-CM | POA: Insufficient documentation

## 2018-08-13 DIAGNOSIS — Z20828 Contact with and (suspected) exposure to other viral communicable diseases: Secondary | ICD-10-CM | POA: Diagnosis not present

## 2018-08-13 DIAGNOSIS — R000 Tachycardia, unspecified: Secondary | ICD-10-CM | POA: Diagnosis not present

## 2018-08-13 DIAGNOSIS — R0609 Other forms of dyspnea: Secondary | ICD-10-CM

## 2018-08-13 DIAGNOSIS — D688 Other specified coagulation defects: Secondary | ICD-10-CM | POA: Insufficient documentation

## 2018-08-13 DIAGNOSIS — I2699 Other pulmonary embolism without acute cor pulmonale: Secondary | ICD-10-CM | POA: Diagnosis not present

## 2018-08-13 DIAGNOSIS — N179 Acute kidney failure, unspecified: Secondary | ICD-10-CM

## 2018-08-13 LAB — CBC WITH DIFFERENTIAL/PLATELET
Abs Immature Granulocytes: 0.03 10*3/uL (ref 0.00–0.07)
Abs Immature Granulocytes: 0.03 10*3/uL (ref 0.00–0.07)
Basophils Absolute: 0 10*3/uL (ref 0.0–0.1)
Basophils Absolute: 0 10*3/uL (ref 0.0–0.1)
Basophils Relative: 0 %
Basophils Relative: 1 %
Eosinophils Absolute: 0.2 10*3/uL (ref 0.0–0.5)
Eosinophils Absolute: 0.3 10*3/uL (ref 0.0–0.5)
Eosinophils Relative: 3 %
Eosinophils Relative: 4 %
HCT: 41.4 % (ref 39.0–52.0)
HCT: 40.8 % (ref 39.0–52.0)
Hemoglobin: 14.4 g/dL (ref 13.0–17.0)
Hemoglobin: 14.1 g/dL (ref 13.0–17.0)
Immature Granulocytes: 0 %
Immature Granulocytes: 0 %
Lymphocytes Relative: 35 %
Lymphocytes Relative: 37 %
Lymphs Abs: 2.4 10*3/uL (ref 0.7–4.0)
Lymphs Abs: 2.6 10*3/uL (ref 0.7–4.0)
MCH: 30.6 pg (ref 26.0–34.0)
MCH: 30.9 pg (ref 26.0–34.0)
MCHC: 34.8 g/dL (ref 30.0–36.0)
MCHC: 34.6 g/dL (ref 30.0–36.0)
MCV: 88.1 fL (ref 80.0–100.0)
MCV: 89.5 fL (ref 80.0–100.0)
Monocytes Absolute: 0.8 10*3/uL (ref 0.1–1.0)
Monocytes Absolute: 0.8 10*3/uL (ref 0.1–1.0)
Monocytes Relative: 12 %
Monocytes Relative: 12 %
Neutro Abs: 3.3 10*3/uL (ref 1.7–7.7)
Neutro Abs: 3.2 10*3/uL (ref 1.7–7.7)
Neutrophils Relative %: 50 %
Neutrophils Relative %: 46 %
Platelets: 137 10*3/uL — ABNORMAL LOW (ref 150–400)
Platelets: 138 10*3/uL — ABNORMAL LOW (ref 150–400)
RBC: 4.7 MIL/uL (ref 4.22–5.81)
RBC: 4.56 MIL/uL (ref 4.22–5.81)
RDW: 15.9 % — ABNORMAL HIGH (ref 11.5–15.5)
RDW: 15.9 % — ABNORMAL HIGH (ref 11.5–15.5)
WBC: 6.7 10*3/uL (ref 4.0–10.5)
WBC: 7 10*3/uL (ref 4.0–10.5)
nRBC: 0 % (ref 0.0–0.2)
nRBC: 0 % (ref 0.0–0.2)

## 2018-08-13 LAB — COMPREHENSIVE METABOLIC PANEL
ALT: 27 U/L (ref 0–44)
ALT: 28 U/L (ref 0–44)
AST: 26 U/L (ref 15–41)
AST: 30 U/L (ref 15–41)
Albumin: 3.9 g/dL (ref 3.5–5.0)
Albumin: 3.8 g/dL (ref 3.5–5.0)
Alkaline Phosphatase: 74 U/L (ref 38–126)
Alkaline Phosphatase: 77 U/L (ref 38–126)
Anion gap: 8 (ref 5–15)
Anion gap: 12 (ref 5–15)
BUN: 8 mg/dL (ref 6–20)
BUN: 9 mg/dL (ref 6–20)
CO2: 24 mmol/L (ref 22–32)
CO2: 20 mmol/L — ABNORMAL LOW (ref 22–32)
Calcium: 9 mg/dL (ref 8.9–10.3)
Calcium: 8.6 mg/dL — ABNORMAL LOW (ref 8.9–10.3)
Chloride: 109 mmol/L (ref 98–111)
Chloride: 105 mmol/L (ref 98–111)
Creatinine, Ser: 0.82 mg/dL (ref 0.61–1.24)
Creatinine, Ser: 0.93 mg/dL (ref 0.61–1.24)
GFR calc Af Amer: >60 mL/min (ref >60)
GFR calc Af Amer: >60 mL/min (ref >60)
GFR calc non Af Amer: >60 mL/min (ref >60)
GFR calc non Af Amer: >60 mL/min (ref >60)
Glucose, Bld: 125 mg/dL — ABNORMAL HIGH (ref 70–99)
Glucose, Bld: 162 mg/dL — ABNORMAL HIGH (ref 70–99)
Potassium: 3.8 mmol/L (ref 3.5–5.1)
Potassium: 4 mmol/L (ref 3.5–5.1)
Sodium: 141 mmol/L (ref 135–145)
Sodium: 137 mmol/L (ref 135–145)
Total Bilirubin: 1.2 mg/dL (ref 0.3–1.2)
Total Bilirubin: 1.1 mg/dL (ref 0.3–1.2)
Total Protein: 6.7 g/dL (ref 6.5–8.1)
Total Protein: 6.3 g/dL — ABNORMAL LOW (ref 6.5–8.1)

## 2018-08-13 LAB — PROTIME-INR
INR: 1 (ref 0.8–1.2)
Prothrombin Time: 13.5 seconds (ref 11.4–15.2)

## 2018-08-13 LAB — LACTATE DEHYDROGENASE: LDH: 250 U/L — ABNORMAL HIGH (ref 98–192)

## 2018-08-13 LAB — BRAIN NATRIURETIC PEPTIDE: B Natriuretic Peptide: 28 pg/mL (ref 0.0–100.0)

## 2018-08-13 LAB — SARS CORONAVIRUS 2 BY RT PCR (HOSPITAL ORDER, PERFORMED IN ~~LOC~~ HOSPITAL LAB): SARS Coronavirus 2: NEGATIVE

## 2018-08-13 LAB — APTT: aPTT: 34 seconds (ref 24–36)

## 2018-08-13 LAB — FIBRIN DERIVATIVES D-DIMER (ARMC ONLY): Fibrin derivatives D-dimer (ARMC): 1056.26 ng/mL (FEU) — ABNORMAL HIGH (ref 0.00–499.00)

## 2018-08-13 LAB — TROPONIN I: Troponin I: <0.03 ng/mL (ref <0.03)

## 2018-08-13 LAB — FERRITIN: Ferritin: 199 ng/mL (ref 24–336)

## 2018-08-13 MED ORDER — SODIUM CHLORIDE 0.9% FLUSH
3.0000 mL | Freq: Two times a day (BID) | INTRAVENOUS | Status: DC
  Administered 2018-08-14 (×2): 3 mL via INTRAVENOUS

## 2018-08-13 MED ORDER — SODIUM CHLORIDE 0.9% FLUSH
3.0000 mL | Freq: Two times a day (BID) | INTRAVENOUS | Status: DC
  Administered 2018-08-14 (×3): 3 mL via INTRAVENOUS

## 2018-08-13 MED ORDER — ACETAMINOPHEN 325 MG PO TABS: 650.0000 mg | ORAL_TABLET | Freq: Four times a day (QID) | ORAL | Status: DC | PRN

## 2018-08-13 MED ORDER — IOHEXOL 350 MG/ML SOLN
75.0000 mL | Freq: Once | INTRAVENOUS | Status: AC | PRN
  Administered 2018-08-13: 75 mL via INTRAVENOUS

## 2018-08-13 MED ORDER — SODIUM CHLORIDE 0.9 % IV SOLN: 250.0000 mL | INTRAVENOUS | Status: DC | PRN

## 2018-08-13 MED ORDER — SODIUM CHLORIDE 0.9% FLUSH: 3.0000 mL | INTRAVENOUS | Status: DC | PRN

## 2018-08-13 MED ORDER — HEPARIN SODIUM (PORCINE) 5000 UNIT/ML IJ SOLN
4000.0000 [IU] | Freq: Once | INTRAMUSCULAR | Status: AC
  Administered 2018-08-13: 18:00:00 4000 [IU] via INTRAVENOUS

## 2018-08-13 MED ORDER — HEPARIN (PORCINE) 25000 UT/250ML-% IV SOLN
1750.0000 [IU]/h | INTRAVENOUS | Status: DC
  Administered 2018-08-13: 1550 [IU]/h via INTRAVENOUS
  Administered 2018-08-14: 1750 [IU]/h via INTRAVENOUS
  Filled 2018-08-13 (×2): qty 250

## 2018-08-13 MED ORDER — POLYETHYLENE GLYCOL 3350 17 G PO PACK: 17.0000 g | PACK | Freq: Every day | ORAL | Status: DC | PRN

## 2018-08-13 MED ORDER — ACETAMINOPHEN 650 MG RE SUPP: 650.0000 mg | Freq: Four times a day (QID) | RECTAL | Status: DC | PRN

## 2018-08-13 MED ORDER — PREDNISONE 10 MG PO TABS: ORAL_TABLET | ORAL | 1 refills | Status: DC

## 2018-08-13 NOTE — ED Triage Notes (Signed)
PT was sent by pulmonologist after finding PE on CT scan today. Pt arrives short of breath but denies current pain.

## 2018-08-13 NOTE — H&P (Signed)
[facility] [place] [facility]   PATIENT NAME: Jason Huerta    MR#:  [id number]  DATE OF BIRTH:  11-Feb-1961  DATE OF ADMISSION:  08/13/2018  PRIMARY CARE PHYSICIAN: Steele Sizerrissman, Mark A, MD   REQUESTING/REFERRING PHYSICIAN: Dorothea GlassmanPaul Malinda, MD  CHIEF COMPLAINT:   Chief Complaint  Patient presents with   Shortness of Breath    HISTORY OF PRESENT ILLNESS:  Jason Huerta  is a 58 y.o. male with a known history of hypertension and a recent history of COVID-19 with acute respiratory distress syndrome.  Patient was originally hospitalized on 07/10/2022 acute respiratory distress associated with COVID-19.  He was discharged home on 07/29/2018.  Patient reports he has noticed recently increasing shortness of breath since his discharge from the hospital.  He is now short of breath with minimal exertion such as speech of 1-2 words.  Patient had seen Dr. Sharol HarnessSimmons in follow-up from recent hospitalization with increasing shortness of breath.  Oxygen saturations were noted to be decreased to 81% with minimal exertion in the office.  Therefore CT chest was completed which demonstrated multiple pulmonary emboli and therefore patient was referred to the emergency room for further evaluation and admission.  COVID-19 testing was repeated with negative rapid COVID-19 test.  Bilateral lower extremity venous Doppler studies were negative for DVT.  Chest CT demonstrated extensive groundglass pulmonary opacity as well as emphysematous changes.  Patient denies recent fevers or chills.  He denies chest pain.  He denies abdominal pain.  He denies previous history of DVT or pulmonary embolism.  He has been admitted to the hospitalist service for bilateral pulmonary embolism and started on heparin infusion. PAST MEDICAL HISTORY:   Past Medical History:  Diagnosis Date   Hypertension    Lichen planus     PAST SURGICAL HISTORY:   Past Surgical History:  Procedure Laterality Date   NO PAST  SURGERIES      SOCIAL HISTORY:   Social History   Tobacco Use   Smoking status: Former Smoker    Packs/day: 2.00    Years: 37.00    Pack years: 74.00    Types: Cigarettes    Quit date: 2012    Years since quitting: 8.4   Smokeless tobacco: Never Used  Substance Use Topics   Alcohol use: Yes    Comment: occ.    FAMILY HISTORY:   Family History  Problem Relation Age of Onset   Cancer Father        colon   Heart attack Father    Hypertension Father     DRUG ALLERGIES:  No Known Allergies  REVIEW OF SYSTEMS:   Review of Systems  Constitutional: Positive for malaise/fatigue. Negative for chills, diaphoresis and fever.  HENT: Negative for congestion, ear pain, sinus pain and sore throat.   Eyes: Negative for blurred vision, double vision and pain.  Respiratory: Positive for shortness of breath (with exertion such as conversation). Negative for cough, hemoptysis, sputum production and wheezing.   Cardiovascular: Negative for chest pain, palpitations, orthopnea and leg swelling.  Gastrointestinal: Negative for abdominal pain, blood in stool, constipation, diarrhea, heartburn, nausea and vomiting.  Genitourinary: Negative for dysuria, flank pain, frequency and hematuria.  Musculoskeletal: Negative for falls, joint pain and myalgias.  Skin: Negative.  Negative for itching and rash.  Neurological: Negative for dizziness, loss of consciousness, weakness and headaches.  Psychiatric/Behavioral: Negative.     MEDICATIONS AT HOME:   Prior to Admission medications   Medication Sig Start Date  End Date Taking? Authorizing Provider  acetaminophen (TYLENOL) 325 MG tablet Take 2 tablets (650 mg total) by mouth every 6 (six) hours as needed for mild pain or headache (fever >/= 101). 07/29/18  Yes Elgergawy, Leana Roeawood S, MD  predniSONE (DELTASONE) 10 MG tablet Take 40 mg (4 tabs) daily for 5 days, then 30 mg (3 tabs) daily for 5 days, then 20 mg (2 tabs) daily for 5 days, then 10 mg  daily until seen in follow-up 08/13/18  Yes Merwyn KatosSimonds, David B, MD  traZODone (DESYREL) 100 MG tablet Take 1 tablet (100 mg total) by mouth at bedtime. 03/12/18  Yes Crissman, Redge GainerMark A, MD  vitamin C (VITAMIN C) 500 MG tablet Take 1 tablet (500 mg total) by mouth daily. Take for 10 days 07/30/18  Yes Elgergawy, Leana Roeawood S, MD  zinc sulfate 220 (50 Zn) MG capsule Take 1 capsule (220 mg total) by mouth daily. Take for 10 days 07/30/18  Yes Elgergawy, Leana Roeawood S, MD      VITAL SIGNS:  Blood pressure 128/77, pulse 82, temperature 99 F (37.2 C), temperature source Oral, resp. rate 19, height 5\' 11"  (1.803 m), weight 107 kg, SpO2 95 %.  PHYSICAL EXAMINATION:  Physical Exam Vitals signs and nursing note reviewed.  Constitutional:      Appearance: He is well-developed.  HENT:     Head: Normocephalic and atraumatic.  Eyes:     Extraocular Movements: Extraocular movements intact.     Pupils: Pupils are equal, round, and reactive to light.  Neck:     Musculoskeletal: Normal range of motion and neck supple.     Vascular: No JVD.  Cardiovascular:     Rate and Rhythm: Normal rate and regular rhythm.     Pulses: Normal pulses.     Heart sounds: Normal heart sounds. No murmur. No friction rub. No gallop.   Pulmonary:     Breath sounds: Examination of the right-lower field reveals decreased breath sounds. Examination of the left-lower field reveals decreased breath sounds. Decreased breath sounds present. No wheezing, rhonchi or rales.  Chest:     Chest wall: No tenderness or edema.  Abdominal:     General: Bowel sounds are normal.     Palpations: Abdomen is soft.     Tenderness: There is no abdominal tenderness. There is no guarding or rebound.  Musculoskeletal: Normal range of motion.     Right lower leg: He exhibits no tenderness. No edema.     Left lower leg: He exhibits no tenderness. No edema.  Skin:    General: Skin is warm and dry.     Capillary Refill: Capillary refill takes less than 2 seconds.       Findings: No erythema or rash.  Neurological:     General: No focal deficit present.     Mental Status: He is alert and oriented to person, place, and time.     Cranial Nerves: No cranial nerve deficit.     Motor: No weakness.  Psychiatric:        Mood and Affect: Mood normal.        Behavior: Behavior normal.    LABORATORY PANEL:   CBC Recent Labs  Lab 08/13/18 1612  WBC 7.0  HGB 14.1  HCT 40.8  PLT 138*   ------------------------------------------------------------------------------------------------------------------  Chemistries  Recent Labs  Lab 08/13/18 1612  NA 137  K 4.0  CL 105  CO2 20*  GLUCOSE 162*  BUN 9  CREATININE 0.93  CALCIUM 8.6*  AST  30  ALT 28  ALKPHOS 77  BILITOT 1.1   ------------------------------------------------------------------------------------------------------------------  Cardiac Enzymes Recent Labs  Lab 08/13/18 1612  TROPONINI <0.03   ------------------------------------------------------------------------------------------------------------------  RADIOLOGY:  Dg Chest 2 View  Result Date: 08/13/2018 CLINICAL DATA:  Worsening shortness of breath. Recent COVID-19 diagnosis. EXAM: CHEST - 2 VIEW COMPARISON:  Chest x-ray dated July 28, 2018. FINDINGS: The heart size and mediastinal contours are within normal limits. Worsened patchy bilateral interstitial and airspace opacities. No pleural effusion or pneumothorax. No acute osseous abnormality. IMPRESSION: 1. Worsened patchy bilateral interstitial and airspace opacities, consistent with history of COVID-19. Electronically Signed   By: Titus Dubin M.D.   On: 08/13/2018 15:04   Ct Angio Chest Pe W Or Wo Contrast  Result Date: 08/13/2018 CLINICAL DATA:  Shortness of breath, positive COVID-19 EXAM: CT ANGIOGRAPHY CHEST WITH CONTRAST TECHNIQUE: Multidetector CT imaging of the chest was performed using the standard protocol during bolus administration of intravenous contrast.  Multiplanar CT image reconstructions and MIPs were obtained to evaluate the vascular anatomy. CONTRAST:  80mL OMNIPAQUE IOHEXOL 350 MG/ML SOLN COMPARISON:  None. FINDINGS: Cardiovascular: Satisfactory opacification of the pulmonary arteries to the segmental level. Positive examination for pulmonary embolism with segmental to subsegmental embolus present bilaterally, for example in the right lower lobe (series 7, image 82) and in the left upper lobe (series 7, image 53). Slightly increased RV LV ratio, 1.0. Three-vessel coronary artery calcifications. No pericardial effusion. Mediastinum/Nodes: No enlarged mediastinal, hilar, or axillary lymph nodes. Thyroid gland, trachea, and esophagus demonstrate no significant findings. Lungs/Pleura: Moderate centrilobular emphysema. There is extensive, predominantly ground-glass pulmonary opacity, much of which is subpleural in distribution. No pleural effusion or pneumothorax. Upper Abdomen: No acute abnormality. Musculoskeletal: No chest wall abnormality. No acute or significant osseous findings. Review of the MIP images confirms the above findings. IMPRESSION: 1. Positive examination for pulmonary embolism with segmental to subsegmental embolus present bilaterally, for example in the right lower lobe (series 7, image 82) and in the left upper lobe (series 7, image 53). 2. Slightly increased RV LV ratio, 1.0. Correlate with echocardiographic findings for evidence of right heart strain. 3. There is extensive, predominantly ground-glass pulmonary opacity, much of which is subpleural in distribution. There are a spectrum of findings in the lungs which can be seen with acute atypical infection (as well as other non-infectious etiologies). In particular, viral pneumonia (including COVID-19) should be considered in the appropriate clinical setting. 4.  Emphysema. 5.  Coronary artery disease. Physician on-call paged to report results at the time of this dictation. Electronically  Signed   By: Eddie Candle M.D.   On: 08/13/2018 15:13   US Venous Img Lower Bilateral  Result Date: 08/13/2018 CLINICAL DATA:  Pulmonary embolism. EXAM: BILATERAL LOWER EXTREMITY VENOUS DOPPLER ULTRASOUND TECHNIQUE: Gray-scale sonography with graded compression, as well as color Doppler and duplex ultrasound were performed to evaluate the lower extremity deep venous systems from the level of the common femoral vein and including the common femoral, femoral, profunda femoral, popliteal and calf veins including the posterior tibial, peroneal and gastrocnemius veins when visible. The superficial great saphenous vein was also interrogated. Spectral Doppler was utilized to evaluate flow at rest and with distal augmentation maneuvers in the common femoral, femoral and popliteal veins. COMPARISON:  None. FINDINGS: RIGHT LOWER EXTREMITY Common Femoral Vein: No evidence of thrombus. Normal compressibility, respiratory phasicity and response to augmentation. Saphenofemoral Junction: No evidence of thrombus. Normal compressibility and flow on color Doppler imaging. Profunda Femoral Vein: No evidence  of thrombus. Normal compressibility and flow on color Doppler imaging. Femoral Vein: No evidence of thrombus. Normal compressibility, respiratory phasicity and response to augmentation. Popliteal Vein: No evidence of thrombus. Normal compressibility, respiratory phasicity and response to augmentation. Calf Veins: No evidence of thrombus. Normal compressibility and flow on color Doppler imaging. Superficial Great Saphenous Vein: No evidence of thrombus. Normal compressibility. Venous Reflux:  None. Other Findings: No evidence of superficial thrombophlebitis or abnormal fluid collection. LEFT LOWER EXTREMITY Common Femoral Vein: No evidence of thrombus. Normal compressibility, respiratory phasicity and response to augmentation. Saphenofemoral Junction: No evidence of thrombus. Normal compressibility and flow on color Doppler  imaging. Profunda Femoral Vein: No evidence of thrombus. Normal compressibility and flow on color Doppler imaging. Femoral Vein: No evidence of thrombus. Normal compressibility, respiratory phasicity and response to augmentation. Popliteal Vein: No evidence of thrombus. Normal compressibility, respiratory phasicity and response to augmentation. Calf Veins: No evidence of thrombus. Normal compressibility and flow on color Doppler imaging. Superficial Great Saphenous Vein: No evidence of thrombus. Normal compressibility. Venous Reflux:  None. Other Findings: No evidence of superficial thrombophlebitis or abnormal fluid collection. IMPRESSION: No evidence of deep venous thrombosis in either lower extremity. Electronically Signed   By: Irish LackGlenn  Yamagata M.D.   On: 08/13/2018 17:40      IMPRESSION AND PLAN:   1.  Bilateral pulmonary embolism - Patient started on heparin infusion - Telemetry monitoring - We will consult Dr. Sharol HarnessSimmons, pulmonology, given patient's recent history of COVID-19 and outpatient follow-up - DuoNebs as needed for shortness of breath or wheezing  2.  Status post recent COVID-19 requiring hospital admission - Patient discharged after being treated for COVID-19/acute respiratory distress syndrome - Pulmonary consulted for further evaluation and recommendations - DuoNebs every 6 hours as needed for shortness of breath and/or cough - COVID-19 testing with rapid test is negative - Patient had been started on steroid therapy as outpatient by Dr. Sharol HarnessSimmons, will continue  3. hypertension  --We will treat hypertension expectantly with PRN antihypertensives -Patient is on telemetry monitoring  DVT and PPI prophylaxis initiated    All the records are reviewed and case discussed with ED provider. The plan of care was discussed in details with the patient (and family). I answered all questions. The patient agreed to proceed with the above mentioned plan. Further management will depend  upon hospital course.   CODE STATUS: Full code  TOTAL TIME TAKING CARE OF THIS PATIENT: 45 minutes.    Milas Kocherngela H Tenaya Hilyer CRNP on 08/13/2018 at 10:47 PM  Pager - [phone]  After 6pm go to [email or web address] - Social research officer, governmentpassword [email or web address] [email or web address]  [facility] [place] [facility]  Office  [phone]  CC: Primary care physician; Steele Sizerrissman, Mark A, MD   Note: This dictation was prepared with Dragon dictation along with smaller phrase technology. Any transcriptional errors that result from this process are unintentional.

## 2018-08-13 NOTE — ED Notes (Signed)
ED TO INPATIENT HANDOFF REPORT  ED Nurse Name and Phone #: Terri Piedra [phone]  S Name/Age/Gender Jason Huerta 58 y.o. male Room/Bed: ED01A/ED01A  Code Status   Code Status: Full Code  Home/SNF/Other Home Patient oriented to: self, place, time and situation Is this baseline? Yes   Triage Complete: Triage complete  Chief Complaint referred by Dr/blood clots  Triage Note PT was sent by pulmonologist after finding PE on CT scan today. Pt arrives short of breath but denies current pain.    Allergies No Known Allergies  Level of Care/Admitting Diagnosis ED Disposition    ED Disposition Condition [facility] Area: [place] [[id number]]  Level of Care: Telemetry [5]  Covid Evaluation: Confirmed COVID Negative  Diagnosis: Pulmonary embolism [facility]) [[id number]]  Admitting Physician: Mayer Camel [[id number]]  Attending Physician: Mayer Camel [[id number]]  Estimated length of stay: 3 - 4 days  Certification:: I certify this patient will need inpatient services for at least 2 midnights  PT Class (Do Not Modify): Inpatient [101]  PT Acc Code (Do Not Modify): Private [1]       B Medical/Surgery History Past Medical History:  Diagnosis Date  . Hypertension   . Lichen planus    Past Surgical History:  Procedure Laterality Date  . NO PAST SURGERIES       A IV Location/Drains/Wounds Patient Lines/Drains/Airways Status   Active Line/Drains/Airways    Name:   Placement date:   Placement time:   Site:   Days:   Peripheral IV 08/13/18 Left Arm   08/13/18    1611    Arm   less than 1   Peripheral IV 08/13/18 Left Forearm   08/13/18    1611    Forearm   less than 1          Intake/Output Last 24 hours No intake or output data in the 24 hours ending 08/13/18 2314  Labs/Imaging Results for orders placed or performed during the hospital encounter of 08/13/18 (from the past 48 hour(s))  CBC with Differential     Status: Abnormal   Collection  Time: 08/13/18  4:12 PM  Result Value Ref Range   WBC 7.0 4.0 - 10.5 K/uL   RBC 4.56 4.22 - 5.81 MIL/uL   Hemoglobin 14.1 13.0 - 17.0 g/dL   HCT 40.8 39.0 - 52.0 %   MCV 89.5 80.0 - 100.0 fL   MCH 30.9 26.0 - 34.0 pg   MCHC 34.6 30.0 - 36.0 g/dL   RDW 15.9 (H) 11.5 - 15.5 %   Platelets 138 (L) 150 - 400 K/uL   nRBC 0.0 0.0 - 0.2 %   Neutrophils Relative % 46 %   Neutro Abs 3.2 1.7 - 7.7 K/uL   Lymphocytes Relative 37 %   Lymphs Abs 2.6 0.7 - 4.0 K/uL   Monocytes Relative 12 %   Monocytes Absolute 0.8 0.1 - 1.0 K/uL   Eosinophils Relative 4 %   Eosinophils Absolute 0.3 0.0 - 0.5 K/uL   Basophils Relative 1 %   Basophils Absolute 0.0 0.0 - 0.1 K/uL   Immature Granulocytes 0 %   Abs Immature Granulocytes 0.03 0.00 - 0.07 K/uL    Comment: Performed at [facility], [address] [place], [place] [address]  Comprehensive metabolic panel     Status: Abnormal   Collection Time: 08/13/18  4:12 PM  Result Value Ref Range   Sodium 137 135 - 145 mmol/L  Potassium 4.0 3.5 - 5.1 mmol/L   Chloride 105 98 - 111 mmol/L   CO2 20 (L) 22 - 32 mmol/L   Glucose, Bld 162 (H) 70 - 99 mg/dL   BUN 9 6 - 20 mg/dL   Creatinine, Ser 0.450.93 0.61 - 1.24 mg/dL   Calcium 8.6 (L) 8.9 - 10.3 mg/dL   Total Protein 6.3 (L) 6.5 - 8.1 g/dL   Albumin 3.8 3.5 - 5.0 g/dL   AST 30 15 - 41 U/L   ALT 28 0 - 44 U/L   Alkaline Phosphatase 77 38 - 126 U/L   Total Bilirubin 1.1 0.3 - 1.2 mg/dL   GFR calc non Af Amer >60 >60 mL/min   GFR calc Af Amer >60 >60 mL/min   Anion gap 12 5 - 15    Comment: Performed at [facility], [address] [place], [place] [address]  Troponin I - ONCE - STAT     Status: None   Collection Time: 08/13/18  4:12 PM  Result Value Ref Range   Troponin I <0.03 <0.03 ng/mL    Comment: Performed at [facility], [address] [place], [place] [address]  SARS Coronavirus 2 (CEPHEID - Performed in [facility] hospital lab), Hosp Order     Status: None    Collection Time: 08/13/18  6:08 PM   Specimen: Nasopharyngeal Swab  Result Value Ref Range   SARS Coronavirus 2 NEGATIVE NEGATIVE    Comment: (NOTE) If result is NEGATIVE SARS-CoV-2 target nucleic acids are NOT DETECTED. The SARS-CoV-2 RNA is generally detectable in upper and lower  respiratory specimens during the acute phase of infection. The lowest  concentration of SARS-CoV-2 viral copies this assay can detect is 250  copies / mL. A negative result does not preclude SARS-CoV-2 infection  and should not be used as the sole basis for treatment or other  patient management decisions.  A negative result may occur with  improper specimen collection / handling, submission of specimen other  than nasopharyngeal swab, presence of viral mutation(s) within the  areas targeted by this assay, and inadequate number of viral copies  (<250 copies / mL). A negative result must be combined with clinical  observations, patient history, and epidemiological information. If result is POSITIVE SARS-CoV-2 target nucleic acids are DETECTED. The SARS-CoV-2 RNA is generally detectable in upper and lower  respiratory specimens dur ing the acute phase of infection.  Positive  results are indicative of active infection with SARS-CoV-2.  Clinical  correlation with patient history and other diagnostic information is  necessary to determine patient infection status.  Positive results do  not rule out bacterial infection or co-infection with other viruses. If result is PRESUMPTIVE POSTIVE SARS-CoV-2 nucleic acids MAY BE PRESENT.   A presumptive positive result was obtained on the submitted specimen  and confirmed on repeat testing.  While 2019 novel coronavirus  (SARS-CoV-2) nucleic acids may be present in the submitted sample  additional confirmatory testing may be necessary for epidemiological  and / or clinical management purposes  to differentiate between  SARS-CoV-2 and other Sarbecovirus currently known  to infect humans.  If clinically indicated additional testing with an alternate test  methodology [phone]([id number]) is advised. The SARS-CoV-2 RNA is generally  detectable in upper and lower respiratory sp ecimens during the acute  phase of infection. The expected result is Negative. Fact Sheet for Patients:  BoilerBrush.com.cyhttps://www.fda.gov/media/136312/download Fact Sheet for Healthcare Providers: https://pope.com/https://www.fda.gov/media/136313/download This test is not yet approved or cleared by  the [employer] [place] FDA and has been authorized for detection and/or diagnosis of SARS-CoV-2 by FDA under an Emergency Use Authorization (EUA).  This EUA will remain in effect (meaning this test can be used) for the duration of the COVID-19 declaration under Section 564(b)(1) of the Act, 21 U.S.C. section 360bbb-3(b)(1), unless the authorization is terminated or revoked sooner. Performed at [facility], [address] [place], [place] [address]   Protime-INR     Status: None   Collection Time: 08/13/18  6:08 PM  Result Value Ref Range   Prothrombin Time 13.5 11.4 - 15.2 seconds   INR 1.0 0.8 - 1.2    Comment: (NOTE) INR goal varies based on device and disease states. Performed at [facility], [address] [place], [place] [address]   APTT     Status: None   Collection Time: 08/13/18  6:08 PM  Result Value Ref Range   aPTT 34 24 - 36 seconds    Comment: Performed at [facility], [address] [place], [place] [address]   Dg Chest 2 View  Result Date: 08/13/2018 CLINICAL DATA:  Worsening shortness of breath. Recent COVID-19 diagnosis. EXAM: CHEST - 2 VIEW COMPARISON:  Chest x-ray dated July 28, 2018. FINDINGS: The heart size and mediastinal contours are within normal limits. Worsened patchy bilateral interstitial and airspace opacities. No pleural effusion or pneumothorax. No acute osseous abnormality. IMPRESSION: 1. Worsened patchy bilateral interstitial and airspace opacities,  consistent with history of COVID-19. Electronically Signed   By: Obie DredgeWilliam T Derry M.D.   On: 08/13/2018 15:04   Ct Angio Chest Pe W Or Wo Contrast  Result Date: 08/13/2018 CLINICAL DATA:  Shortness of breath, positive COVID-19 EXAM: CT ANGIOGRAPHY CHEST WITH CONTRAST TECHNIQUE: Multidetector CT imaging of the chest was performed using the standard protocol during bolus administration of intravenous contrast. Multiplanar CT image reconstructions and MIPs were obtained to evaluate the vascular anatomy. CONTRAST:  75mL OMNIPAQUE IOHEXOL 350 MG/ML SOLN COMPARISON:  None. FINDINGS: Cardiovascular: Satisfactory opacification of the pulmonary arteries to the segmental level. Positive examination for pulmonary embolism with segmental to subsegmental embolus present bilaterally, for example in the right lower lobe (series 7, image 82) and in the left upper lobe (series 7, image 53). Slightly increased RV LV ratio, 1.0. Three-vessel coronary artery calcifications. No pericardial effusion. Mediastinum/Nodes: No enlarged mediastinal, hilar, or axillary lymph nodes. Thyroid gland, trachea, and esophagus demonstrate no significant findings. Lungs/Pleura: Moderate centrilobular emphysema. There is extensive, predominantly ground-glass pulmonary opacity, much of which is subpleural in distribution. No pleural effusion or pneumothorax. Upper Abdomen: No acute abnormality. Musculoskeletal: No chest wall abnormality. No acute or significant osseous findings. Review of the MIP images confirms the above findings. IMPRESSION: 1. Positive examination for pulmonary embolism with segmental to subsegmental embolus present bilaterally, for example in the right lower lobe (series 7, image 82) and in the left upper lobe (series 7, image 53). 2. Slightly increased RV LV ratio, 1.0. Correlate with echocardiographic findings for evidence of right heart strain. 3. There is extensive, predominantly ground-glass pulmonary opacity, much of which is  subpleural in distribution. There are a spectrum of findings in the lungs which can be seen with acute atypical infection (as well as other non-infectious etiologies). In particular, viral pneumonia (including COVID-19) should be considered in the appropriate clinical setting. 4.  Emphysema. 5.  Coronary artery disease. Physician on-call paged to report results at the time of this dictation. Electronically Signed   By: Erasmo ScoreAlex  Bibbey M.D.  On: 08/13/2018 15:13   [place] Venous Img Lower Bilateral  Result Date: 08/13/2018 CLINICAL DATA:  Pulmonary embolism. EXAM: BILATERAL LOWER EXTREMITY VENOUS DOPPLER ULTRASOUND TECHNIQUE: Gray-scale sonography with graded compression, as well as color Doppler and duplex ultrasound were performed to evaluate the lower extremity deep venous systems from the level of the common femoral vein and including the common femoral, femoral, profunda femoral, popliteal and calf veins including the posterior tibial, peroneal and gastrocnemius veins when visible. The superficial great saphenous vein was also interrogated. Spectral Doppler was utilized to evaluate flow at rest and with distal augmentation maneuvers in the common femoral, femoral and popliteal veins. COMPARISON:  None. FINDINGS: RIGHT LOWER EXTREMITY Common Femoral Vein: No evidence of thrombus. Normal compressibility, respiratory phasicity and response to augmentation. Saphenofemoral Junction: No evidence of thrombus. Normal compressibility and flow on color Doppler imaging. Profunda Femoral Vein: No evidence of thrombus. Normal compressibility and flow on color Doppler imaging. Femoral Vein: No evidence of thrombus. Normal compressibility, respiratory phasicity and response to augmentation. Popliteal Vein: No evidence of thrombus. Normal compressibility, respiratory phasicity and response to augmentation. Calf Veins: No evidence of thrombus. Normal compressibility and flow on color Doppler imaging. Superficial Great Saphenous  Vein: No evidence of thrombus. Normal compressibility. Venous Reflux:  None. Other Findings: No evidence of superficial thrombophlebitis or abnormal fluid collection. LEFT LOWER EXTREMITY Common Femoral Vein: No evidence of thrombus. Normal compressibility, respiratory phasicity and response to augmentation. Saphenofemoral Junction: No evidence of thrombus. Normal compressibility and flow on color Doppler imaging. Profunda Femoral Vein: No evidence of thrombus. Normal compressibility and flow on color Doppler imaging. Femoral Vein: No evidence of thrombus. Normal compressibility, respiratory phasicity and response to augmentation. Popliteal Vein: No evidence of thrombus. Normal compressibility, respiratory phasicity and response to augmentation. Calf Veins: No evidence of thrombus. Normal compressibility and flow on color Doppler imaging. Superficial Great Saphenous Vein: No evidence of thrombus. Normal compressibility. Venous Reflux:  None. Other Findings: No evidence of superficial thrombophlebitis or abnormal fluid collection. IMPRESSION: No evidence of deep venous thrombosis in either lower extremity. Electronically Signed   By: Irish LackGlenn  Yamagata M.D.   On: 08/13/2018 17:40    Pending Labs Unresulted Labs (From admission, onward)    Start     Ordered   08/14/18 0500  CBC  Daily,   STAT    Comments: Daily CBC's while on Heparin drip    08/13/18 1703   08/14/18 0500  Basic metabolic panel  Tomorrow morning,   STAT     08/13/18 2246   08/14/18 0500  Protime-INR  Tomorrow morning,   STAT     08/13/18 2246   08/14/18 0500  APTT  Tomorrow morning,   STAT     08/13/18 2246   08/13/18 2330  Heparin level (unfractionated)  Once-Timed,   STAT     08/13/18 1719   08/13/18 2246  TSH  Once,   STAT     08/13/18 2246   08/13/18 2246  Brain natriuretic peptide  Once,   STAT     08/13/18 2246          Vitals/Pain Today's Vitals   08/13/18 1900 08/13/18 2000 08/13/18 2100 08/13/18 2200  BP: 127/84  130/80 128/77 (!) 153/96  Pulse: 86 76 81 82  Resp: (!) 24 (!) 23 (!) 26 19  Temp:      TempSrc:      SpO2: 95% 96% 95% 95%  Weight:      Height:  PainSc:        Isolation Precautions No active isolations  Medications Medications  heparin ADULT infusion 100 units/mL (25000 units/269mL sodium chloride 0.45%) (1,550 Units/hr Intravenous New Bag/Given 08/13/18 1814)  sodium chloride flush (NS) 0.9 % injection 3 mL (has no administration in time range)  sodium chloride flush (NS) 0.9 % injection 3 mL (has no administration in time range)  sodium chloride flush (NS) 0.9 % injection 3 mL (has no administration in time range)  0.9 %  sodium chloride infusion (has no administration in time range)  acetaminophen (TYLENOL) tablet 650 mg (has no administration in time range)    Or  acetaminophen (TYLENOL) suppository 650 mg (has no administration in time range)  polyethylene glycol (MIRALAX / GLYCOLAX) packet 17 g (has no administration in time range)  heparin injection 4,000 Units (4,000 Units Intravenous Given 08/13/18 1819)    Mobility walks Low fall risk   Focused Assessments Pulmonary Assessment Handoff:  Lung sounds:   O2 Device: Nasal Cannula O2 Flow Rate (L/min): 2 L/min      R Recommendations: See Admitting Provider Note  Report given to:   Additional Notes: Multiple PEs

## 2018-08-13 NOTE — Consult Note (Signed)
ANTICOAGULATION CONSULT NOTE - Follow Up Consult  Pharmacy Consult for Heparin Drip Indication: PE  No Known Allergies  Patient Measurements: Height: 5\' 11"  (180.3 cm) Weight: 236 lb (107 kg) IBW/kg (Calculated) : 75.3 Heparin Dosing Weight: 98kg  Vital Signs: Temp: 99 F (37.2 C) (06/18 1600) Temp Source: Oral (06/18 1600) BP: 158/92 (06/18 1557) Pulse Rate: 110 (06/18 1557)  Labs: Recent Labs    08/13/18 1314 08/13/18 1612  HGB 14.4 14.1  HCT 41.4 40.8  PLT 137* 138*  CREATININE 0.82 0.93  TROPONINI  --  <0.03    Estimated Creatinine Clearance: 106.5 mL/min (by C-G formula based on SCr of 0.93 mg/dL).   Medications:  No PTA anticoagulation  Assessment: Previous COVID pt now COVID NEG. CTA chest reveals B PEs and moderately severe bilateral interstitial opacities  Goal of Therapy:  Heparin level 0.3-0.7 units/ml Monitor platelets by anticoagulation protocol: Yes   Plan:  Will give 4000 unit heparin bolus, followed by 1550 units/hr.    Pt baseline HGB 14.1 and plt 138 - Obtaining baseline APTT and INR  Will check Heparin Level (HL) in 6 hours per protocol and pharmacy will monitor/adjust accordingly.  Will obtain daily CBC on heparin drip per protocol.    Lu Duffel, PharmD, BCPS Clinical Pharmacist 08/13/2018 4:59 PM

## 2018-08-13 NOTE — Progress Notes (Addendum)
PULMONARY CONSULT NOTE  Requesting MD/Service: Dossie Arbourrissman Date of initial consultation: 08/13/18 Reason for consultation: Recent COVID-19. Persistent severe dyspnea  PT PROFILE: 58 y.o. male former smoker employed at [employer] [facility] where there was a recent COVID-19 outbreak.  He was hospitalized 5/26-07/29/18.  He did not require intubation but he was on high flow nasal cannula oxygen (at one point 15 LPM).  He was treated with Remdesivir, Tocilizumab, dexamethasone and ultimately with convalescent plasma.  He was not discharged to home on oxygen therapy and steroid therapy was discontinued at the time of discharge  DATA:  INTERVAL:  HPI:  As above.  Onset of illness was 07/10/2018.  Hospitalized 07/21/2018.  Since discharge he has profound exertional dyspnea.  He has difficulty walking across the room and can barely finish 1 flight of stairs.  He is previously very healthy with no prior pulmonary history.  He denies pleuritic or anginal chest pain.  He has no significant cough or fever.  He denies hemoptysis.  He has no lower extremity edema or calf tenderness.   Dr. Annabell Howellshrissman initiated Advair therapy a few days ago.  Patient is unable to discern any benefit with this inhaler.  He underwent repeat testing for coronavirus 2 days ago and it was negative.  Past Medical History:  Diagnosis Date  . Hypertension   . Lichen planus     Past Surgical History:  Procedure Laterality Date  . NO PAST SURGERIES      MEDICATIONS: I have reviewed all medications and confirmed regimen as documented  Social History   Socioeconomic History  . Marital status: Married    Spouse name: Not on file  . Number of children: Not on file  . Years of education: Not on file  . Highest education level: Not on file  Occupational History  . Not on file  Social Needs  . Financial resource strain: Not on file  . Food insecurity    Worry: Not on file    Inability: Not on file  .  Transportation needs    Medical: Not on file    Non-medical: Not on file  Tobacco Use  . Smoking status: Former Smoker    Packs/day: 2.00    Years: 37.00    Pack years: 74.00    Types: Cigarettes    Quit date: 2012    Years since quitting: 8.4  . Smokeless tobacco: Never Used  Substance and Sexual Activity  . Alcohol use: Yes    Comment: occ.  . Drug use: No  . Sexual activity: Not on file  Lifestyle  . Physical activity    Days per week: Not on file    Minutes per session: Not on file  . Stress: Not on file  Relationships  . Social Musicianconnections    Talks on phone: Not on file    Gets together: Not on file    Attends religious service: Not on file    Active member of club or organization: Not on file    Attends meetings of clubs or organizations: Not on file    Relationship status: Not on file  . Intimate partner violence    Fear of current or ex partner: Not on file    Emotionally abused: Not on file    Physically abused: Not on file    Forced sexual activity: Not on file  Other Topics Concern  . Not on file  Social History Narrative   Reside alone     Family  History  Problem Relation Age of Onset  . Cancer Father        colon  . Heart attack Father   . Hypertension Father     ROS: No fever, myalgias/arthralgias, unexplained weight loss or weight gain No new focal weakness or sensory deficits No otalgia, hearing loss, visual changes, nasal and sinus symptoms, mouth and throat problems No neck pain or adenopathy No abdominal pain, N/V/D, diarrhea, change in bowel pattern No dysuria, change in urinary pattern   Vitals:   08/13/18 1020 08/13/18 1027  BP:  132/78  Pulse:  (!) 113  Temp:  97.9 F (36.6 C)  TempSrc:  Temporal  SpO2:  90%  Weight: 235 lb 12.8 oz (107 kg)   Height: 5\' 11"  (1.803 m)   Room air  After ambulation of 2 laps around our office, he desaturated to 81%.   EXAM:  Gen: WDWN, he becomes breathless with minimal exertion and mildly  breathless with speech HEENT: NCAT, sclera white, oropharynx normal Neck: Supple without LAN, thyromegaly, JVD Lungs: breath sounds full, percussion normal, adventitious sounds: Minimal bibasilar crackles Cardiovascular: RRR, no murmurs noted Abdomen: Soft, nontender, normal BS Ext: without clubbing, cyanosis, edema Neuro: CNs grossly intact, motor and sensory intact Skin: Limited exam, no lesions noted  DATA:   BMP Latest Ref Rng & Units 07/29/2018 07/28/2018 07/27/2018  Glucose 70 - 99 mg/dL 127(H) 149(H) 159(H)  BUN 6 - 20 mg/dL 21(H) 20 24(H)  Creatinine 0.61 - 1.24 mg/dL 0.95 1.00 0.92  BUN/Creat Ratio 9 - 20 - - -  Sodium 135 - 145 mmol/L 138 139 139  Potassium 3.5 - 5.1 mmol/L 3.4(L) 3.8 3.6  Chloride 98 - 111 mmol/L 99 101 99  CO2 22 - 32 mmol/L 28 26 28   Calcium 8.9 - 10.3 mg/dL 8.8(L) 8.6(L) 8.4(L)    CBC Latest Ref Rng & Units 08/13/2018 07/29/2018 07/28/2018  WBC 4.0 - 10.5 K/uL 6.7 4.5 4.6  Hemoglobin 13.0 - 17.0 g/dL 14.4 15.3 15.9  Hematocrit 39.0 - 52.0 % 41.4 45.2 46.2  Platelets 150 - 400 K/uL 137(L) 273 279    CXR: Patchy, predominantly peripheral bilateral infiltrates consistent with recent SARS-CoV-2 infection  I have personally reviewed all chest radiographs reported above including CXRs and CT chest unless otherwise indicated  IMPRESSION:      1. Acute respiratory failure with hypoxia ([place])   2. Recent COVID-19 infection  3. Viral pneumonitis with residual pulmonary infiltrates  4. Severe exertional dyspnea   5. Shortness of breath    I suspect he is experiencing the aftermath of a very severe COVID-19 pneumonia/pneumonitis.  It is unclear which of the therapies provided during the hospitalization were of most benefit.  I am surprised that the corticosteroids were discontinued after 2 or 3 days (according to him).  More recent data suggest that systemic corticosteroids can reduce the morbidity and mortality associated with severe SARS-CoV-2 pneumonia.  His chest  x-ray shows persistent infiltrates and no significant improvement compared to those obtained during his hospitalization.  Ambulatory oximetry demonstrates severe desaturation.  Because COVID-19 is a hypercoagulable state, we have to consider the possibility of pulmonary embolism  His profound exertional dyspnea is probably multifactorial but certainly hypoxemia is a major contributing factor.  PLAN:  Blood tests ordered today: CBC, BNP, d-dimer, LDH.  In addition, he has a CMP ordered by Dr. Luvenia Heller and we will see if we can get this performed today as well.  CT angiogram of chest to rule out  pulmonary embolism ordered for today  We will initiate oxygen therapy and he is instructed to wear it at 2-3 LPM by nasal cannula with any exertion and with sleep.  He may take the oxygen off for brief periods while sitting quietly.  I expect that his need for oxygen therapy will be temporary, (likely weeks) as he recovers from COVID-19.  Initiate prednisone: 40 mg daily for 5 days, 30 mg daily for 5 days, 20 mg daily for 5 days, 10 mg daily until seen in follow-up  He may discontinue Advair inhaler if he does not believe that it is of any benefit  I have instructed him to increase his activity level gradually   Follow-up in 3-4 weeks.  Call sooner if needed  Billy Fischeravid Maleeya Peterkin, MD PCCM service Mobile [phone] Pager [phone] 08/13/2018 1:42 PM   ADDENDUM: LDH elevated but improved. FDP elevated (different test so can't be compared to D-dimer). CTA chest reveals B PEs and moderately severe bilateral interstitial opacities. There is also mild emphysema in upper lobes.  I have contacted pt and informed him of the findings. I feel that the safest thing to do would be to admit him for a couple of days of monitoring and to get anticoagulation initiated. He should remain on steroids as I outlined above. He should undergo BLE venous US to assess for LE DVT. I will see him in hospital as consultant  tomorrow  Billy Fischeravid Michaila Kenney, MD PCCM service Mobile [phone] Pager [phone] 08/13/2018 3:24 PM

## 2018-08-13 NOTE — Telephone Encounter (Signed)
Labs were corrected.  Called pt to confirm that he had labs drawn. Nothing further is needed.

## 2018-08-13 NOTE — Patient Instructions (Addendum)
Initiate oxygen therapy.  Wear 2-3 LPM by nasal cannula with sleep and any exertion.  Oxygen may be removed for brief periods while sitting quietly.  It is my expectation that oxygen therapy is temporary (likely for weeks) as you continue to recover from COVID-19  Prednisone: 10 mg tablets prescribed.  Take 40 mg daily for 5 days, 30 mg daily for 5 days, 20 mg daily for 5 days, 10 mg daily until seen in follow-up  CT angiogram of chest to rule out pulmonary embolism (blood clot to the lungs) and further evaluate abnormalities noted on chest x-ray today  Blood test today: CBC, BNP, d-dimer, LDH  You may discontinue Advair inhaler if you do not believe that it is beneficial  Gradually increase activity level as tolerated to prevent further deconditioning as discussed

## 2018-08-13 NOTE — ED Provider Notes (Addendum)
[facility] Emergency Department Provider Note   ____________________________________________   First MD Initiated Contact with Patient 08/13/18 1717     (approximate)  I have reviewed the triage vital signs and the nursing notes.   HISTORY  Chief Complaint Shortness of Breath    HPI Jason Huerta Prescher is a 58 y.o. male patient recently discharged for treatment for COVID.  Patient had increasing shortness of breath and went to see his physician.  O2 sats dropped down to 81 with exertion in the office.  CT of the chest was done which showed multiple pulmonary emboli.  Dr. Sharol HarnessSimmons who saw him in the office came here.  He wants the patient to get a bilateral leg ultrasound to rule out DVT started on heparin get a COVID test and if that is negative he can be admitted here if it still positive us back [facility].         Past Medical History:  Diagnosis Date   Hypertension    Lichen planus     Patient Active Problem List   Diagnosis Date Noted   Pulmonary embolism (HCC) 08/13/2018   LFT elevation 07/22/2018   Hyperglycemia 07/22/2018   Hypokalemia 07/22/2018   Thrombocytopenia (HCC) 07/22/2018   AKI (acute kidney injury) (HCC) 07/22/2018   Acute respiratory disease due to COVID-19 virus 07/21/2018   ED (erectile dysfunction) 03/25/2017   Insomnia 08/17/2015   Hypertension     Past Surgical History:  Procedure Laterality Date   NO PAST SURGERIES      Prior to Admission medications   Medication Sig Start Date End Date Taking? Authorizing Provider  acetaminophen (TYLENOL) 325 MG tablet Take 2 tablets (650 mg total) by mouth every 6 (six) hours as needed for mild pain or headache (fever >/= 101). 07/29/18  Yes Elgergawy, Leana Roeawood S, MD  predniSONE (DELTASONE) 10 MG tablet Take 40 mg (4 tabs) daily for 5 days, then 30 mg (3 tabs) daily for 5 days, then 20 mg (2 tabs) daily for 5 days, then 10 mg daily until seen in  follow-up 08/13/18  Yes Merwyn KatosSimonds, David B, MD  traZODone (DESYREL) 100 MG tablet Take 1 tablet (100 mg total) by mouth at bedtime. 03/12/18  Yes Crissman, Redge GainerMark A, MD  vitamin C (VITAMIN C) 500 MG tablet Take 1 tablet (500 mg total) by mouth daily. Take for 10 days 07/30/18  Yes Elgergawy, Leana Roeawood S, MD  zinc sulfate 220 (50 Zn) MG capsule Take 1 capsule (220 mg total) by mouth daily. Take for 10 days 07/30/18  Yes Elgergawy, Leana Roeawood S, MD    Allergies Patient has no known allergies.  Family History  Problem Relation Age of Onset   Cancer Father        colon   Heart attack Father    Hypertension Father     Social History Social History   Tobacco Use   Smoking status: Former Smoker    Packs/day: 2.00    Years: 37.00    Pack years: 74.00    Types: Cigarettes    Quit date: 2012    Years since quitting: 8.4   Smokeless tobacco: Never Used  Substance Use Topics   Alcohol use: Yes    Comment: occ.   Drug use: No    Review of Systems  Constitutional: No fever/chills Eyes: No visual changes. ENT: No sore throat. Cardiovascular: Denies chest pain. Respiratory: Denies shortness of breath. Gastrointestinal: No abdominal pain.  No  nausea, no vomiting.  No diarrhea.  No constipation. Genitourinary: Negative for dysuria. Musculoskeletal: Negative for back pain. Skin: Negative for rash. Neurological: Negative for headaches, focal weakness   ____________________________________________   PHYSICAL EXAM:  VITAL SIGNS: ED Triage Vitals  Enc Vitals Group     BP 08/13/18 1557 (!) 158/92     Pulse Rate 08/13/18 1557 (!) 110     Resp 08/13/18 1557 (!) 29     Temp 08/13/18 1600 99 F (37.2 C)     Temp Source 08/13/18 1600 Oral     SpO2 08/13/18 1557 (!) 89 %     Weight 08/13/18 1557 236 lb (107 kg)     Height 08/13/18 1557 5\' 11"  (1.803 m)     Head Circumference --      Peak Flow --      Pain Score 08/13/18 1556 0     Pain Loc --      Pain Edu? --      Excl. in [place]? --      Constitutional: Alert and oriented. Well appearing and in no acute distress.  But he does get short of breath when he walks around the bed.   Eyes: Conjunctivae are normal. PERRL. EOMI. Head: Atraumatic. Nose: No congestion/rhinnorhea. Mouth/Throat: Mucous membranes are moist.  Oropharynx non-erythematous. Neck: No stridor.  Cardiovascular: Normal rate, regular rhythm. Grossly normal heart sounds.  Good peripheral circulation. Respiratory: Normal respiratory effort.  No retractions. Lungs CTAB. Gastrointestinal: Soft and nontender. No distention. No abdominal bruits. No CVA tenderness. Musculoskeletal: No lower extremity tenderness nor edema.  No joint effusions. Neurologic:  Normal speech and language. No gross focal neurologic deficits are appreciated. No gait instability. Skin:  Skin is warm, dry and intact. No rash noted.   ____________________________________________   LABS (all labs ordered are listed, but only abnormal results are displayed)  Labs Reviewed  CBC WITH DIFFERENTIAL/PLATELET - Abnormal; Notable for the following components:      Result Value   RDW 15.9 (*)    Platelets 138 (*)    All other components within normal limits  COMPREHENSIVE METABOLIC PANEL - Abnormal; Notable for the following components:   CO2 20 (*)    Glucose, Bld 162 (*)    Calcium 8.6 (*)    Total Protein 6.3 (*)    All other components within normal limits  SARS CORONAVIRUS 2 (HOSPITAL ORDER, [place] [facility])  TROPONIN I  PROTIME-INR  APTT  CBC  HEPARIN LEVEL (UNFRACTIONATED)  BASIC METABOLIC PANEL  PROTIME-INR  APTT  TSH  BRAIN NATRIURETIC PEPTIDE   ____________________________________________  EKG  KG read interpreted by me shows sinus tachycardia rate of 101 normal axis no acute ST-T wave changes ____________________________________________  RADIOLOGY  ED MD interpretation: This x-ray read by radiology reviewed by me shows patchy bilateral  interstitial and airspace opacities consistent with his history of COVID-19 his chest CT shows pulmonary emboli Ultrasound read by radiology did not show any DVTs official radiology report(s): Dg Chest 2 View  Result Date: 08/13/2018 CLINICAL DATA:  Worsening shortness of breath. Recent COVID-19 diagnosis. EXAM: CHEST - 2 VIEW COMPARISON:  Chest x-ray dated July 28, 2018. FINDINGS: The heart size and mediastinal contours are within normal limits. Worsened patchy bilateral interstitial and airspace opacities. No pleural effusion or pneumothorax. No acute osseous abnormality. IMPRESSION: 1. Worsened patchy bilateral interstitial and airspace opacities, consistent with history of COVID-19. Electronically Signed   By: Titus Dubin M.D.   On: 08/13/2018 15:04  Ct Angio Chest Pe W Or Wo Contrast  Result Date: 08/13/2018 CLINICAL DATA:  Shortness of breath, positive COVID-19 EXAM: CT ANGIOGRAPHY CHEST WITH CONTRAST TECHNIQUE: Multidetector CT imaging of the chest was performed using the standard protocol during bolus administration of intravenous contrast. Multiplanar CT image reconstructions and MIPs were obtained to evaluate the vascular anatomy. CONTRAST:  75mL OMNIPAQUE IOHEXOL 350 MG/ML SOLN COMPARISON:  None. FINDINGS: Cardiovascular: Satisfactory opacification of the pulmonary arteries to the segmental level. Positive examination for pulmonary embolism with segmental to subsegmental embolus present bilaterally, for example in the right lower lobe (series 7, image 82) and in the left upper lobe (series 7, image 53). Slightly increased RV LV ratio, 1.0. Three-vessel coronary artery calcifications. No pericardial effusion. Mediastinum/Nodes: No enlarged mediastinal, hilar, or axillary lymph nodes. Thyroid gland, trachea, and esophagus demonstrate no significant findings. Lungs/Pleura: Moderate centrilobular emphysema. There is extensive, predominantly ground-glass pulmonary opacity, much of which is  subpleural in distribution. No pleural effusion or pneumothorax. Upper Abdomen: No acute abnormality. Musculoskeletal: No chest wall abnormality. No acute or significant osseous findings. Review of the MIP images confirms the above findings. IMPRESSION: 1. Positive examination for pulmonary embolism with segmental to subsegmental embolus present bilaterally, for example in the right lower lobe (series 7, image 82) and in the left upper lobe (series 7, image 53). 2. Slightly increased RV LV ratio, 1.0. Correlate with echocardiographic findings for evidence of right heart strain. 3. There is extensive, predominantly ground-glass pulmonary opacity, much of which is subpleural in distribution. There are a spectrum of findings in the lungs which can be seen with acute atypical infection (as well as other non-infectious etiologies). In particular, viral pneumonia (including COVID-19) should be considered in the appropriate clinical setting. 4.  Emphysema. 5.  Coronary artery disease. Physician on-call paged to report results at the time of this dictation. Electronically Signed   By: Lauralyn PrimesAlex  Bibbey M.D.   On: 08/13/2018 15:13   [place] Venous Img Lower Bilateral  Result Date: 08/13/2018 CLINICAL DATA:  Pulmonary embolism. EXAM: BILATERAL LOWER EXTREMITY VENOUS DOPPLER ULTRASOUND TECHNIQUE: Gray-scale sonography with graded compression, as well as color Doppler and duplex ultrasound were performed to evaluate the lower extremity deep venous systems from the level of the common femoral vein and including the common femoral, femoral, profunda femoral, popliteal and calf veins including the posterior tibial, peroneal and gastrocnemius veins when visible. The superficial great saphenous vein was also interrogated. Spectral Doppler was utilized to evaluate flow at rest and with distal augmentation maneuvers in the common femoral, femoral and popliteal veins. COMPARISON:  None. FINDINGS: RIGHT LOWER EXTREMITY Common Femoral Vein: No  evidence of thrombus. Normal compressibility, respiratory phasicity and response to augmentation. Saphenofemoral Junction: No evidence of thrombus. Normal compressibility and flow on color Doppler imaging. Profunda Femoral Vein: No evidence of thrombus. Normal compressibility and flow on color Doppler imaging. Femoral Vein: No evidence of thrombus. Normal compressibility, respiratory phasicity and response to augmentation. Popliteal Vein: No evidence of thrombus. Normal compressibility, respiratory phasicity and response to augmentation. Calf Veins: No evidence of thrombus. Normal compressibility and flow on color Doppler imaging. Superficial Great Saphenous Vein: No evidence of thrombus. Normal compressibility. Venous Reflux:  None. Other Findings: No evidence of superficial thrombophlebitis or abnormal fluid collection. LEFT LOWER EXTREMITY Common Femoral Vein: No evidence of thrombus. Normal compressibility, respiratory phasicity and response to augmentation. Saphenofemoral Junction: No evidence of thrombus. Normal compressibility and flow on color Doppler imaging. Profunda Femoral Vein: No evidence of thrombus. Normal compressibility and  flow on color Doppler imaging. Femoral Vein: No evidence of thrombus. Normal compressibility, respiratory phasicity and response to augmentation. Popliteal Vein: No evidence of thrombus. Normal compressibility, respiratory phasicity and response to augmentation. Calf Veins: No evidence of thrombus. Normal compressibility and flow on color Doppler imaging. Superficial Great Saphenous Vein: No evidence of thrombus. Normal compressibility. Venous Reflux:  None. Other Findings: No evidence of superficial thrombophlebitis or abnormal fluid collection. IMPRESSION: No evidence of deep venous thrombosis in either lower extremity. Electronically Signed   By: Irish LackGlenn  Yamagata M.D.   On: 08/13/2018 17:40    ____________________________________________   PROCEDURES  Procedure(s)  performed (including Critical Care): Critical care time 15 minutes.  This includes evaluating the patient reviewing his CT scan and discussing him with the hospitalist in some depth.  Procedures   ____________________________________________   INITIAL IMPRESSION / ASSESSMENT AND PLAN / ED COURSE  Jason Huerta Inman was evaluated in Emergency Department on 08/13/2018 for the symptoms described in the history of present illness. He was evaluated in the context of the global COVID-19 pandemic, which necessitated consideration that the patient might be at risk for infection with the SARS-CoV-2 virus that causes COVID-19. Institutional protocols and algorithms that pertain to the evaluation of patients at risk for COVID-19 are in a state of rapid change based on information released by regulatory bodies including the CDC and federal and state organizations. These policies and algorithms were followed during the patient's care in the ED.              ____________________________________________   FINAL CLINICAL IMPRESSION(S) / ED DIAGNOSES  Final diagnoses:  Acute pulmonary embolism, unspecified pulmonary embolism type, unspecified whether acute cor pulmonale present [facility](HCC)     ED Discharge Orders    None       Note:  This document was prepared using Dragon voice recognition software and may include unintentional dictation errors.    Arnaldo NatalMalinda, Gifford Ballon F, MD 08/13/18 [id number]    Arnaldo NatalMalinda, Ruairi Stutsman F, MD 08/31/18 2122

## 2018-08-13 NOTE — ED Notes (Signed)
Hospitalist to bedside.

## 2018-08-13 NOTE — ED Notes (Signed)
Pt ambulatory to bathroom independently

## 2018-08-14 ENCOUNTER — Telehealth: Payer: Self-pay | Admitting: Pulmonary Disease

## 2018-08-14 ENCOUNTER — Telehealth: Payer: Self-pay

## 2018-08-14 DIAGNOSIS — I10 Essential (primary) hypertension: Secondary | ICD-10-CM | POA: Diagnosis not present

## 2018-08-14 DIAGNOSIS — Z8619 Personal history of other infectious and parasitic diseases: Secondary | ICD-10-CM | POA: Diagnosis not present

## 2018-08-14 DIAGNOSIS — I2694 Multiple subsegmental pulmonary emboli without acute cor pulmonale: Secondary | ICD-10-CM | POA: Diagnosis not present

## 2018-08-14 DIAGNOSIS — I2699 Other pulmonary embolism without acute cor pulmonale: Secondary | ICD-10-CM | POA: Diagnosis not present

## 2018-08-14 DIAGNOSIS — R0609 Other forms of dyspnea: Secondary | ICD-10-CM

## 2018-08-14 DIAGNOSIS — R0600 Dyspnea, unspecified: Secondary | ICD-10-CM

## 2018-08-14 LAB — BASIC METABOLIC PANEL
Anion gap: 4 — ABNORMAL LOW (ref 5–15)
BUN: 10 mg/dL (ref 6–20)
CO2: 27 mmol/L (ref 22–32)
Calcium: 8.3 mg/dL — ABNORMAL LOW (ref 8.9–10.3)
Chloride: 109 mmol/L (ref 98–111)
Creatinine, Ser: 0.9 mg/dL (ref 0.61–1.24)
GFR calc Af Amer: >60 mL/min (ref >60)
GFR calc non Af Amer: >60 mL/min (ref >60)
Glucose, Bld: 121 mg/dL — ABNORMAL HIGH (ref 70–99)
Potassium: 3.9 mmol/L (ref 3.5–5.1)
Sodium: 140 mmol/L (ref 135–145)

## 2018-08-14 LAB — PROTIME-INR
INR: 1.1 (ref 0.8–1.2)
Prothrombin Time: 13.8 seconds (ref 11.4–15.2)

## 2018-08-14 LAB — CBC
HCT: 37.9 % — ABNORMAL LOW (ref 39.0–52.0)
Hemoglobin: 13.3 g/dL (ref 13.0–17.0)
MCH: 31.1 pg (ref 26.0–34.0)
MCHC: 35.1 g/dL (ref 30.0–36.0)
MCV: 88.8 fL (ref 80.0–100.0)
Platelets: 116 10*3/uL — ABNORMAL LOW (ref 150–400)
RBC: 4.27 MIL/uL (ref 4.22–5.81)
RDW: 16 % — ABNORMAL HIGH (ref 11.5–15.5)
WBC: 6.3 10*3/uL (ref 4.0–10.5)
nRBC: 0 % (ref 0.0–0.2)

## 2018-08-14 LAB — APTT
aPTT: >160 seconds (ref 24–36)
aPTT: >160 seconds (ref 24–36)

## 2018-08-14 LAB — BRAIN NATRIURETIC PEPTIDE: B Natriuretic Peptide: 27 pg/mL (ref 0.0–100.0)

## 2018-08-14 LAB — HEPARIN LEVEL (UNFRACTIONATED)
Heparin Unfractionated: 0.21 IU/mL — ABNORMAL LOW (ref 0.30–0.70)
Heparin Unfractionated: 0.76 IU/mL — ABNORMAL HIGH (ref 0.30–0.70)

## 2018-08-14 LAB — TSH: TSH: 1.817 u[IU]/mL (ref 0.350–4.500)

## 2018-08-14 MED ORDER — ZINC SULFATE 220 (50 ZN) MG PO CAPS
220.0000 mg | ORAL_CAPSULE | Freq: Every day | ORAL | Status: DC
  Administered 2018-08-14 – 2018-08-15 (×2): 220 mg via ORAL
  Filled 2018-08-14 (×2): qty 1

## 2018-08-14 MED ORDER — APIXABAN 5 MG PO TABS: 5.0000 mg | ORAL_TABLET | Freq: Two times a day (BID) | ORAL | Status: DC

## 2018-08-14 MED ORDER — ORAL CARE MOUTH RINSE: 15.0000 mL | Freq: Two times a day (BID) | OROMUCOSAL | Status: DC

## 2018-08-14 MED ORDER — APIXABAN 5 MG PO TABS
10.0000 mg | ORAL_TABLET | Freq: Two times a day (BID) | ORAL | Status: DC
  Administered 2018-08-14 – 2018-08-15 (×3): 10 mg via ORAL
  Filled 2018-08-14 (×3): qty 2

## 2018-08-14 MED ORDER — HEPARIN BOLUS VIA INFUSION
1400.0000 [IU] | Freq: Once | INTRAVENOUS | Status: AC
  Administered 2018-08-14: 1400 [IU] via INTRAVENOUS
  Filled 2018-08-14: qty 1400

## 2018-08-14 MED ORDER — METHYLPREDNISOLONE SODIUM SUCC 40 MG IJ SOLR
40.0000 mg | Freq: Every day | INTRAMUSCULAR | Status: DC
  Administered 2018-08-14: 40 mg via INTRAVENOUS
  Filled 2018-08-14: qty 1

## 2018-08-14 MED ORDER — TRAZODONE HCL 100 MG PO TABS
100.0000 mg | ORAL_TABLET | Freq: Every day | ORAL | Status: DC
  Administered 2018-08-14 (×2): 100 mg via ORAL
  Filled 2018-08-14 (×2): qty 1

## 2018-08-14 MED ORDER — HEPARIN (PORCINE) 25000 UT/250ML-% IV SOLN
1600.0000 [IU]/h | INTRAVENOUS | Status: DC
  Administered 2018-08-14: 1600 [IU]/h via INTRAVENOUS

## 2018-08-14 NOTE — Progress Notes (Signed)
[employer] [facility] [place] [facility]                                                                                                                                                                                  Patient Demographics   Jason Huerta, is a 58 y.o. male, DOB - 08/15/1960, EAV:[id number]  Admit date - 08/13/2018   Admitting Physician Hannah BeatJan A Mansy, MD  Outpatient Primary MD for the patient is Steele Sizerrissman, Mark A, MD   LOS - 1  Subjective: Patient continues to be short of breath denies any chest pain   Review of Systems:   CONSTITUTIONAL: No documented fever. No fatigue, weakness. No weight gain, no weight loss.  EYES: No blurry or double vision.  ENT: No tinnitus. No postnasal drip. No redness of the oropharynx.  RESPIRATORY: No cough, no wheeze, no hemoptysis.  Positive dyspnea.  CARDIOVASCULAR: No chest pain. No orthopnea. No palpitations. No syncope.  GASTROINTESTINAL: No nausea, no vomiting or diarrhea. No abdominal pain. No melena or hematochezia.  GENITOURINARY: No dysuria or hematuria.  ENDOCRINE: No polyuria or nocturia. No heat or cold intolerance.  HEMATOLOGY: No anemia. No bruising. No bleeding.  INTEGUMENTARY: No rashes. No lesions.  MUSCULOSKELETAL: No arthritis. No swelling. No gout.  NEUROLOGIC: No numbness, tingling, or ataxia. No seizure-type activity.  PSYCHIATRIC: No anxiety. No insomnia. No ADD.    Vitals:   Vitals:   08/13/18 2303 08/13/18 2357 08/14/18 0605 08/14/18 0745  BP: 139/89 131/84 112/77 (!) 146/87  Pulse: 81 83 87 91  Resp:      Temp:  98.3 F (36.8 C) 98.5 F (36.9 C) 98 F (36.7 C)  TempSrc:  Oral Oral Oral  SpO2: 95% 92% (!) 89% 92%  Weight:  106 kg    Height:  5\' 11"  (1.803 m)      Wt Readings from Last 3 Encounters:  08/13/18 106 kg  08/13/18 107 kg  07/21/18 113.4 kg     Intake/Output Summary (Last 24 hours) at 08/14/2018 1312 Last data filed at 08/14/2018 1304 Gross per 24 hour  Intake 605.37 ml   Output 0 ml  Net 605.37 ml    Physical Exam:   GENERAL: Pleasant-appearing in no apparent distress.  HEAD, EYES, EARS, NOSE AND THROAT: Atraumatic, normocephalic. Extraocular muscles are intact. Pupils equal and reactive to light. Sclerae anicteric. No conjunctival injection. No oro-pharyngeal erythema.  NECK: Supple. There is no jugular venous distention. No bruits, no lymphadenopathy, no thyromegaly.  HEART: Regular rate and rhythm,. No murmurs, no rubs, no clicks.  LUNGS: Clear to auscultation bilaterally. No rales or rhonchi. No wheezes.  ABDOMEN: Soft, flat, nontender, nondistended. Has good bowel  sounds. No hepatosplenomegaly appreciated.  EXTREMITIES: No evidence of any cyanosis, clubbing, or peripheral edema.  +2 pedal and radial pulses bilaterally.  NEUROLOGIC: The patient is alert, awake, and oriented x3 with no focal motor or sensory deficits appreciated bilaterally.  SKIN: Moist and warm with no rashes appreciated.  Psych: Not anxious, depressed LN: No inguinal LN enlargement    Antibiotics   Anti-infectives (From admission, onward)   None      Medications   Scheduled Meds: . apixaban  10 mg Oral BID   Followed by  . [START ON 08/21/2018] apixaban  5 mg Oral BID  . mouth rinse  15 mL Mouth Rinse BID  . methylPREDNISolone (SOLU-MEDROL) injection  40 mg Intravenous Daily  . sodium chloride flush  3 mL Intravenous Q12H  . sodium chloride flush  3 mL Intravenous Q12H  . traZODone  100 mg Oral QHS  . zinc sulfate  220 mg Oral Daily   Continuous Infusions: . sodium chloride     PRN Meds:.sodium chloride, acetaminophen **OR** acetaminophen, polyethylene glycol, sodium chloride flush   Data Review:   Micro Results Recent Results (from the past 240 hour(s))  SARS Coronavirus 2 (CEPHEID - Performed in [facility] hospital lab), Hosp Order     Status: None   Collection Time: 08/13/18  6:08 PM   Specimen: Nasopharyngeal Swab  Result Value Ref Range Status   SARS  Coronavirus 2 NEGATIVE NEGATIVE Final    Comment: (NOTE) If result is NEGATIVE SARS-CoV-2 target nucleic acids are NOT DETECTED. The SARS-CoV-2 RNA is generally detectable in upper and lower  respiratory specimens during the acute phase of infection. The lowest  concentration of SARS-CoV-2 viral copies this assay can detect is 250  copies / mL. A negative result does not preclude SARS-CoV-2 infection  and should not be used as the sole basis for treatment or other  patient management decisions.  A negative result may occur with  improper specimen collection / handling, submission of specimen other  than nasopharyngeal swab, presence of viral mutation(s) within the  areas targeted by this assay, and inadequate number of viral copies  (<250 copies / mL). A negative result must be combined with clinical  observations, patient history, and epidemiological information. If result is POSITIVE SARS-CoV-2 target nucleic acids are DETECTED. The SARS-CoV-2 RNA is generally detectable in upper and lower  respiratory specimens dur ing the acute phase of infection.  Positive  results are indicative of active infection with SARS-CoV-2.  Clinical  correlation with patient history and other diagnostic information is  necessary to determine patient infection status.  Positive results do  not rule out bacterial infection or co-infection with other viruses. If result is PRESUMPTIVE POSTIVE SARS-CoV-2 nucleic acids MAY BE PRESENT.   A presumptive positive result was obtained on the submitted specimen  and confirmed on repeat testing.  While 2019 novel coronavirus  (SARS-CoV-2) nucleic acids may be present in the submitted sample  additional confirmatory testing may be necessary for epidemiological  and / or clinical management purposes  to differentiate between  SARS-CoV-2 and other Sarbecovirus currently known to infect humans.  If clinically indicated additional testing with an alternate test   methodology [phone]) is advised. The SARS-CoV-2 RNA is generally  detectable in upper and lower respiratory sp ecimens during the acute  phase of infection. The expected result is Negative. Fact Sheet for Patients:  StrictlyIdeas.no Fact Sheet for Healthcare Providers: BankingDealers.co.za This test is not yet approved or cleared by the [place]  States FDA and has been authorized for detection and/or diagnosis of SARS-CoV-2 by FDA under an Emergency Use Authorization (EUA).  This EUA will remain in effect (meaning this test can be used) for the duration of the COVID-19 declaration under Section 564(b)(1) of the Act, 21 U.S.C. section 360bbb-3(b)(1), unless the authorization is terminated or revoked sooner. Performed at [facility], [address], [place], [place] [address]     Radiology Reports Dg Chest 2 View  Result Date: 08/13/2018 CLINICAL DATA:  Worsening shortness of breath. Recent COVID-19 diagnosis. EXAM: CHEST - 2 VIEW COMPARISON:  Chest x-ray dated July 28, 2018. FINDINGS: The heart size and mediastinal contours are within normal limits. Worsened patchy bilateral interstitial and airspace opacities. No pleural effusion or pneumothorax. No acute osseous abnormality. IMPRESSION: 1. Worsened patchy bilateral interstitial and airspace opacities, consistent with history of COVID-19. Electronically Signed   By: Obie Dredge M.D.   On: 08/13/2018 15:04   Ct Angio Chest Pe W Or Wo Contrast  Result Date: 08/13/2018 CLINICAL DATA:  Shortness of breath, positive COVID-19 EXAM: CT ANGIOGRAPHY CHEST WITH CONTRAST TECHNIQUE: Multidetector CT imaging of the chest was performed using the standard protocol during bolus administration of intravenous contrast. Multiplanar CT image reconstructions and MIPs were obtained to evaluate the vascular anatomy. CONTRAST:  75mL OMNIPAQUE IOHEXOL 350 MG/ML SOLN COMPARISON:  None. FINDINGS:  Cardiovascular: Satisfactory opacification of the pulmonary arteries to the segmental level. Positive examination for pulmonary embolism with segmental to subsegmental embolus present bilaterally, for example in the right lower lobe (series 7, image 82) and in the left upper lobe (series 7, image 53). Slightly increased RV LV ratio, 1.0. Three-vessel coronary artery calcifications. No pericardial effusion. Mediastinum/Nodes: No enlarged mediastinal, hilar, or axillary lymph nodes. Thyroid gland, trachea, and esophagus demonstrate no significant findings. Lungs/Pleura: Moderate centrilobular emphysema. There is extensive, predominantly ground-glass pulmonary opacity, much of which is subpleural in distribution. No pleural effusion or pneumothorax. Upper Abdomen: No acute abnormality. Musculoskeletal: No chest wall abnormality. No acute or significant osseous findings. Review of the MIP images confirms the above findings. IMPRESSION: 1. Positive examination for pulmonary embolism with segmental to subsegmental embolus present bilaterally, for example in the right lower lobe (series 7, image 82) and in the left upper lobe (series 7, image 53). 2. Slightly increased RV LV ratio, 1.0. Correlate with echocardiographic findings for evidence of right heart strain. 3. There is extensive, predominantly ground-glass pulmonary opacity, much of which is subpleural in distribution. There are a spectrum of findings in the lungs which can be seen with acute atypical infection (as well as other non-infectious etiologies). In particular, viral pneumonia (including COVID-19) should be considered in the appropriate clinical setting. 4.  Emphysema. 5.  Coronary artery disease. Physician on-call paged to report results at the time of this dictation. Electronically Signed   By: Lauralyn Primes M.D.   On: 08/13/2018 15:13   US Venous Img Lower Bilateral  Result Date: 08/13/2018 CLINICAL DATA:  Pulmonary embolism. EXAM: BILATERAL LOWER  EXTREMITY VENOUS DOPPLER ULTRASOUND TECHNIQUE: Gray-scale sonography with graded compression, as well as color Doppler and duplex ultrasound were performed to evaluate the lower extremity deep venous systems from the level of the common femoral vein and including the common femoral, femoral, profunda femoral, popliteal and calf veins including the posterior tibial, peroneal and gastrocnemius veins when visible. The superficial great saphenous vein was also interrogated. Spectral Doppler was utilized to evaluate flow at rest and with distal augmentation maneuvers in the common femoral,  femoral and popliteal veins. COMPARISON:  None. FINDINGS: RIGHT LOWER EXTREMITY Common Femoral Vein: No evidence of thrombus. Normal compressibility, respiratory phasicity and response to augmentation. Saphenofemoral Junction: No evidence of thrombus. Normal compressibility and flow on color Doppler imaging. Profunda Femoral Vein: No evidence of thrombus. Normal compressibility and flow on color Doppler imaging. Femoral Vein: No evidence of thrombus. Normal compressibility, respiratory phasicity and response to augmentation. Popliteal Vein: No evidence of thrombus. Normal compressibility, respiratory phasicity and response to augmentation. Calf Veins: No evidence of thrombus. Normal compressibility and flow on color Doppler imaging. Superficial Great Saphenous Vein: No evidence of thrombus. Normal compressibility. Venous Reflux:  None. Other Findings: No evidence of superficial thrombophlebitis or abnormal fluid collection. LEFT LOWER EXTREMITY Common Femoral Vein: No evidence of thrombus. Normal compressibility, respiratory phasicity and response to augmentation. Saphenofemoral Junction: No evidence of thrombus. Normal compressibility and flow on color Doppler imaging. Profunda Femoral Vein: No evidence of thrombus. Normal compressibility and flow on color Doppler imaging. Femoral Vein: No evidence of thrombus. Normal compressibility,  respiratory phasicity and response to augmentation. Popliteal Vein: No evidence of thrombus. Normal compressibility, respiratory phasicity and response to augmentation. Calf Veins: No evidence of thrombus. Normal compressibility and flow on color Doppler imaging. Superficial Great Saphenous Vein: No evidence of thrombus. Normal compressibility. Venous Reflux:  None. Other Findings: No evidence of superficial thrombophlebitis or abnormal fluid collection. IMPRESSION: No evidence of deep venous thrombosis in either lower extremity. Electronically Signed   By: Irish LackGlenn  Yamagata M.D.   On: 08/13/2018 17:40   Dg Chest Port 1 View  Result Date: 07/28/2018 CLINICAL DATA:  Hypoxia EXAM: PORTABLE CHEST 1 VIEW COMPARISON:  Jul 24, 2018 and Jul 21, 2018. FINDINGS: There is diffuse interstitial prominence, likely due to fibrosis. There is no appreciable consolidation or airspace opacity. Heart is upper normal in size with pulmonary vascularity normal. No adenopathy. There is aortic atherosclerosis. No bone lesions. IMPRESSION: Diffuse interstitial prominence, likely due to fibrosis, stable compared to recent prior studies. A degree of interstitial pneumonitis of infectious etiology cannot be excluded. No airspace consolidation evident. No adenopathy evident. Stable cardiac silhouette. Aortic Atherosclerosis (ICD10-I70.0). Electronically Signed   By: Bretta BangWilliam  Woodruff III M.D.   On: 07/28/2018 13:40   Dg Chest Port 1 View  Result Date: 07/24/2018 CLINICAL DATA:  Shortness of breath. EXAM: PORTABLE CHEST 1 VIEW COMPARISON:  07/22/2018.  07/21/2018. FINDINGS: Heart size normal. Diffuse bilateral interstitial prominence noted is unchanged. An active interstitial process including pneumonitis could present this fashion. No new findings noted. No pleural effusion or pneumothorax. IMPRESSION: Diffuse bilateral pulmonary interstitial prominence is again noted and is unchanged. An active interstitial process including pneumonitis  could present in this fashion. Chest is unchanged from prior exams. Electronically Signed   By: Maisie Fushomas  Register   On: 07/24/2018 07:39   Dg Chest Port 1 View  Result Date: 07/22/2018 CLINICAL DATA:  Acute respiratory failure due to COVID-19 EXAM: PORTABLE CHEST 1 VIEW COMPARISON:  Yesterday FINDINGS: Stable generalized interstitial opacity. Normal heart size. No visible effusion or pneumothorax. IMPRESSION: Stable generalized interstitial opacity. Electronically Signed   By: Marnee SpringJonathon  Watts M.D.   On: 07/22/2018 08:05   Dg Chest Portable 1 View  Result Date: 07/21/2018 CLINICAL DATA:  Shortness of breath. EXAM: PORTABLE CHEST 1 VIEW COMPARISON:  No recent. FINDINGS: Mediastinum and hilar structures normal. Heart size normal. Diffuse bilateral pulmonary interstitial prominence. An active interstitial process including pneumonitis and or interstitial edema could present in this fashion. Tiny left pleural effusion  cannot be excluded. No pneumothorax. Degenerative change thoracic spine. IMPRESSION: Diffuse bilateral from interstitial prominence. An active interstitial process including pneumonitis and/or interstitial edema could present this fashion. Tiny left pleural effusion cannot be excluded. Electronically Signed   By: Maisie Fushomas  Register   On: 07/21/2018 14:04     CBC Recent Labs  Lab 08/13/18 1314 08/13/18 1612 08/14/18 0541  WBC 6.7 7.0 6.3  HGB 14.4 14.1 13.3  HCT 41.4 40.8 37.9*  PLT 137* 138* 116*  MCV 88.1 89.5 88.8  MCH 30.6 30.9 31.1  MCHC 34.8 34.6 35.1  RDW 15.9* 15.9* 16.0*  LYMPHSABS 2.4 2.6  --   MONOABS 0.8 0.8  --   EOSABS 0.2 0.3  --   BASOSABS 0.0 0.0  --     Chemistries  Recent Labs  Lab 08/13/18 1314 08/13/18 1612 08/14/18 0541  NA 141 137 140  K 3.8 4.0 3.9  CL 109 105 109  CO2 24 20* 27  GLUCOSE 125* 162* 121*  BUN 8 9 10   CREATININE 0.82 0.93 0.90  CALCIUM 9.0 8.6* 8.3*  AST 26 30  --   ALT 27 28  --   ALKPHOS 74 77  --   BILITOT 1.2 1.1  --     ------------------------------------------------------------------------------------------------------------------ estimated creatinine clearance is 109.5 mL/min (by C-G formula based on SCr of 0.9 mg/dL). ------------------------------------------------------------------------------------------------------------------ No results for input(s): HGBA1C in the last 72 hours. ------------------------------------------------------------------------------------------------------------------ No results for input(s): CHOL, HDL, LDLCALC, TRIG, CHOLHDL, LDLDIRECT in the last 72 hours. ------------------------------------------------------------------------------------------------------------------ Recent Labs    08/14/18 0019  TSH 1.817   ------------------------------------------------------------------------------------------------------------------ Recent Labs    08/13/18 1314  FERRITIN 199    Coagulation profile Recent Labs  Lab 08/13/18 1808 08/14/18 0541  INR 1.0 1.1    No results for input(s): DDIMER in the last 72 hours.  Cardiac Enzymes Recent Labs  Lab 08/13/18 1612  TROPONINI <0.03   ------------------------------------------------------------------------------------------------------------------ Invalid input(s): POCBNP    Assessment & Plan   1.  Bilateral pulmonary embolism suspect this is related to post COVID hyper coagulable state - Patient started on heparin infusion -  Patient on Eliquis   2.  Status post recent COVID-19  Patient with inflammatory changes in the lungs seen by Dr. Darrol AngelSimons who recommends prednisone taper at discharge  3. hypertension  --Patient's blood pressure stable  4. dvt proph Eliquis       Code Status Orders  (From admission, onward)         Start     Ordered   08/13/18 2245  Full code  Continuous     08/13/18 2246        Code Status History    Date Active Date Inactive Code Status Order ID Comments User Context    07/21/2018 2239 07/29/2018 1805 Full Code [id number]  Tyrone NineGrunz, Ryan B, MD Inpatient   Advance Care Planning Activity           Consults pulmonary critical care  DVT Prophylaxis Eliquis Lab Results  Component Value Date   PLT 116 (L) 08/14/2018     Time Spent in minutes   35min Greater than 50% of time spent in care coordination and counseling patient regarding the condition and plan of care.   Auburn BilberryShreyang Aalijah Mims M.D on 08/14/2018 at 1:12 PM  Between 7am to 6pm - Pager - [phone]  After 6pm go to [email or web address] - Social research officer, governmentpassword [email or web address]  [employer]   Office  [phone]

## 2018-08-14 NOTE — Progress Notes (Signed)
PULMONARY FOLLOW-UP NOTE  Requesting MD/Service: Jeananne Rama Date of initial consultation: 08/13/18 Reason for consultation: Recent COVID-19. Persistent severe dyspnea  PT PROFILE: 58 y.o. male former smoker employed at [employer] facility where there was a recent COVID-19 outbreak.  He was hospitalized 5/26-07/29/18.  He did not require intubation but he was on high flow nasal cannula oxygen (at one point 15 LPM).  He was treated with Remdesivir, Tocilizumab, dexamethasone and ultimately with convalescent plasma.  He was not discharged to home on oxygen therapy and steroid therapy was discontinued at the time of discharge  DATA: 08/13/18 CTA chest: Mild emphysematous changes, patchy diffuse bilateral interstitial infiltrates (recent SARS-CoV-2 pneumonia), bilateral segmental and slight segmental pulmonary emboli  08/13/18 LE venous US: No evidence of DVT  INTERVAL:  SUBJ:  No new complaints.  Feels that shortness of breath has improved.  No distress on [place] O2   Vitals:   08/13/18 2303 08/13/18 2357 08/14/18 0605 08/14/18 0745  BP: 139/89 131/84 112/77 (!) 146/87  Pulse: 81 83 87 91  Resp:      Temp:  98.3 F (36.8 C) 98.5 F (36.9 C) 98 F (36.7 C)  TempSrc:  Oral Oral Oral  SpO2: 95% 92% (!) 89% 92%  Weight:  106 kg    Height:  5\' 11"  (1.803 m)    2 LPM Pump Back   EXAM:  Gen: NAD HEENT: NCAT, sclerae white Neck: No JVD Lungs: breath sounds full, no wheezes or other adventitious sounds Cardiovascular: RRR, no murmurs Abdomen: Soft, nontender, normal BS Ext: without clubbing, cyanosis, edema Neuro: grossly intact Skin: Limited exam, no lesions noted   DATA:   BMP Latest Ref Rng & Units 08/14/2018 08/13/2018 08/13/2018  Glucose 70 - 99 mg/dL 121(H) 162(H) 125(H)  BUN 6 - 20 mg/dL 10 9 8   Creatinine 0.61 - 1.24 mg/dL 0.90 0.93 0.82  BUN/Creat Ratio 9 - 20 - - -  Sodium 135 - 145 mmol/L 140 137 141  Potassium 3.5 - 5.1 mmol/L 3.9 4.0 3.8  Chloride 98 - 111 mmol/L  109 105 109  CO2 22 - 32 mmol/L 27 20(L) 24  Calcium 8.9 - 10.3 mg/dL 8.3(L) 8.6(L) 9.0    CBC Latest Ref Rng & Units 08/14/2018 08/13/2018 08/13/2018  WBC 4.0 - 10.5 K/uL 6.3 7.0 6.7  Hemoglobin 13.0 - 17.0 g/dL 13.3 14.1 14.4  Hematocrit 39.0 - 52.0 % 37.9(L) 40.8 41.4  Platelets 150 - 400 K/uL 116(L) 138(L) 137(L)    CXR: No new film  I have personally reviewed all chest radiographs reported above including CXRs and CT chest unless otherwise indicated  IMPRESSION:   Acute/subacute hypoxemic respiratory failure due to Recent COVID-19 with severe viral pneumonitis and persistent pulmonary infiltrates and acute pulmonary embolism  PLAN:  Okay to discharge home on oral anticoagulation.  He will need 6 months of full anticoagulation.  Home oxygen therapy has already been arranged through my office  I want him to go home on tapering prednisone as I outlined in my office note yesterday  I will arrange for follow-up in my office in 3-4 weeks with CXR prior to that visit  Merton Border, MD PCCM service Mobile [phone] Pager [phone] 08/14/2018 11:11 AM

## 2018-08-14 NOTE — Consult Note (Signed)
ANTICOAGULATION CONSULT NOTE - Follow Up Consult  Pharmacy Consult for Heparin Drip Indication: PE  No Known Allergies  Patient Measurements: Height: 5\' 11"  (180.3 cm) Weight: 233 lb 11.2 oz (106 kg) IBW/kg (Calculated) : 75.3 Heparin Dosing Weight: 98kg  Vital Signs: Temp: 98.3 F (36.8 C) (06/18 2357) Temp Source: Oral (06/18 2357) BP: 131/84 (06/18 2357) Pulse Rate: 83 (06/18 2357)  Labs: Recent Labs    08/13/18 1314 08/13/18 1612 08/13/18 1808 08/14/18 0019  HGB 14.4 14.1  --   --   HCT 41.4 40.8  --   --   PLT 137* 138*  --   --   APTT  --   --  34  --   LABPROT  --   --  13.5  --   INR  --   --  1.0  --   HEPARINUNFRC  --   --   --  0.21*  CREATININE 0.82 0.93  --   --   TROPONINI  --  <0.03  --   --     Estimated Creatinine Clearance: 106 mL/min (by C-G formula based on SCr of 0.93 mg/dL).   Medications:  No PTA anticoagulation  Assessment: Previous COVID pt now COVID NEG. CTA chest reveals B PEs and moderately severe bilateral interstitial opacities  6/19 00:19 HL 0.21  Goal of Therapy:  Heparin level 0.3-0.7 units/ml Monitor platelets by anticoagulation protocol: Yes   Plan:  6/19 Heparin level of 0.21 is subtherapeutic. Will order Heparin 1400 units IV bolus and increase drip rate to 1750 units/hr. Will check next HL in 6 hours. Daily CBC while on Heparin drip.  Paulina Fusi, PharmD, BCPS 08/14/2018 2:16 AM

## 2018-08-14 NOTE — Telephone Encounter (Signed)
I spoke to Dr. Alva Garnet, he stated that yes, this was fine, he had already talked to his doctor on the floor and gave him the updated plan. I spoke to patient and updated him. Also set him up for 3-4 week f/u. Patient is aware to get CXR before apt. Order entered.

## 2018-08-14 NOTE — Telephone Encounter (Signed)
-----   Message from Wilhelmina Mcardle, MD sent at 08/14/2018 11:09 AM EDT ----- He is likely to be discharge to home today or tomorrow AM. Please make sure that he has a follow up appt in 3-4 weeks with CXR prior   Thanks  Waunita Schooner

## 2018-08-14 NOTE — Telephone Encounter (Signed)
Relayed above info from Dr. Alva Garnet.

## 2018-08-14 NOTE — Consult Note (Signed)
ANTICOAGULATION CONSULT NOTE - Initial Consult  Pharmacy Consult for Apixaban Indication: pulmonary embolus  No Known Allergies  Patient Measurements: Height: 5\' 11"  (180.3 cm) Weight: 233 lb 11.2 oz (106 kg) IBW/kg (Calculated) : 75.3  Vital Signs: Temp: 98 F (36.7 C) (06/19 0745) Temp Source: Oral (06/19 0745) BP: 146/87 (06/19 0745) Pulse Rate: 91 (06/19 0745)  Labs: Recent Labs    08/13/18 1314 08/13/18 1612 08/13/18 1808 08/14/18 0019 08/14/18 0541 08/14/18 0827  HGB 14.4 14.1  --   --  13.3  --   HCT 41.4 40.8  --   --  37.9*  --   PLT 137* 138*  --   --  116*  --   APTT  --   --  34  --  >160* >160*  LABPROT  --   --  13.5  --  13.8  --   INR  --   --  1.0  --  1.1  --   HEPARINUNFRC  --   --   --  0.21*  --  0.76*  CREATININE 0.82 0.93  --   --  0.90  --   TROPONINI  --  <0.03  --   --   --   --     Estimated Creatinine Clearance: 109.5 mL/min (by C-G formula based on SCr of 0.9 mg/dL).   Medical History: Past Medical History:  Diagnosis Date  . Hypertension   . Lichen planus     Medications:  Medications Prior to Admission  Medication Sig Dispense Refill Last Dose  . acetaminophen (TYLENOL) 325 MG tablet Take 2 tablets (650 mg total) by mouth every 6 (six) hours as needed for mild pain or headache (fever >/= 101).   prn at prn  . predniSONE (DELTASONE) 10 MG tablet Take 40 mg (4 tabs) daily for 5 days, then 30 mg (3 tabs) daily for 5 days, then 20 mg (2 tabs) daily for 5 days, then 10 mg daily until seen in follow-up 100 tablet 1   . traZODone (DESYREL) 100 MG tablet Take 1 tablet (100 mg total) by mouth at bedtime. 30 tablet 2 08/12/2018 at Unknown time  . vitamin C (VITAMIN C) 500 MG tablet Take 1 tablet (500 mg total) by mouth daily. Take for 10 days   08/12/2018 at Unknown time  . zinc sulfate 220 (50 Zn) MG capsule Take 1 capsule (220 mg total) by mouth daily. Take for 10 days   08/12/2018 at Unknown time   Scheduled:  . apixaban  10 mg Oral BID    Followed by  . [START ON 08/21/2018] apixaban  5 mg Oral BID  . mouth rinse  15 mL Mouth Rinse BID  . methylPREDNISolone (SOLU-MEDROL) injection  40 mg Intravenous Daily  . sodium chloride flush  3 mL Intravenous Q12H  . sodium chloride flush  3 mL Intravenous Q12H  . traZODone  100 mg Oral QHS  . zinc sulfate  220 mg Oral Daily   Infusions:  . sodium chloride     PRN: sodium chloride, acetaminophen **OR** acetaminophen, polyethylene glycol, sodium chloride flush  Assessment: Pharmacy consulted to stop heparin and start apixaban.   Goal of Therapy:  Monitor platelets by anticoagulation protocol: Yes   Plan:  Will start apixaban 10 mg BID x 7 days, followed by 5 mg BID thereafter. Continue to monitor CBC.   Oswald Hillock, PharmD, BCPS  08/14/2018,11:25 AM

## 2018-08-14 NOTE — Consult Note (Addendum)
ANTICOAGULATION CONSULT NOTE - Follow Up Consult  Pharmacy Consult for Heparin Drip Indication: PE  No Known Allergies  Patient Measurements: Height: 5\' 11"  (180.3 cm) Weight: 233 lb 11.2 oz (106 kg) IBW/kg (Calculated) : 75.3 Heparin Dosing Weight: 98kg  Vital Signs: Temp: 98 F (36.7 C) (06/19 0745) Temp Source: Oral (06/19 0745) BP: 146/87 (06/19 0745) Pulse Rate: 91 (06/19 0745)  Labs: Recent Labs    08/13/18 1314 08/13/18 1612 08/13/18 1808 08/14/18 0019 08/14/18 0541 08/14/18 0827  HGB 14.4 14.1  --   --  13.3  --   HCT 41.4 40.8  --   --  37.9*  --   PLT 137* 138*  --   --  116*  --   APTT  --   --  34  --  >160* >160*  LABPROT  --   --  13.5  --  13.8  --   INR  --   --  1.0  --  1.1  --   HEPARINUNFRC  --   --   --  0.21*  --  0.76*  CREATININE 0.82 0.93  --   --  0.90  --   TROPONINI  --  <0.03  --   --   --   --     Estimated Creatinine Clearance: 109.5 mL/min (by C-G formula based on SCr of 0.9 mg/dL).   Medications:  No PTA anticoagulation  Assessment: Previous COVID pt now COVID NEG. CTA chest reveals B PEs and moderately severe bilateral interstitial opacities. Baseline aPTT is 34 - repeat aPTT > 160.   6/19 00:19 HL 0.21 6/19 08:27 HL 0.76 aPTT > 160. Hold infusion for 1 hour    Goal of Therapy:  Heparin level 0.3-0.7 units/ml Monitor platelets by anticoagulation protocol: Yes   Plan:  Heparin level is supratherapeutic and aPTT returned elevated x 2. Will hold the infusion for 1 hour and restart heparin at a rate of 1600 unit/hr to be safe. Unsure why the aPTT was elevated so high. Will check next HL in 6 hours. Daily CBC while on Heparin drip.  Eleonore Chiquito, PharmD, BCPS 08/14/2018 9:13 AM

## 2018-08-15 DIAGNOSIS — I10 Essential (primary) hypertension: Secondary | ICD-10-CM | POA: Diagnosis not present

## 2018-08-15 DIAGNOSIS — I2699 Other pulmonary embolism without acute cor pulmonale: Secondary | ICD-10-CM | POA: Diagnosis not present

## 2018-08-15 DIAGNOSIS — Z8619 Personal history of other infectious and parasitic diseases: Secondary | ICD-10-CM | POA: Diagnosis not present

## 2018-08-15 DIAGNOSIS — U071 COVID-19: Secondary | ICD-10-CM | POA: Diagnosis not present

## 2018-08-15 LAB — CBC
HCT: 38.9 % — ABNORMAL LOW (ref 39.0–52.0)
Hemoglobin: 13.4 g/dL (ref 13.0–17.0)
MCH: 30.7 pg (ref 26.0–34.0)
MCHC: 34.4 g/dL (ref 30.0–36.0)
MCV: 89.2 fL (ref 80.0–100.0)
Platelets: 135 10*3/uL — ABNORMAL LOW (ref 150–400)
RBC: 4.36 MIL/uL (ref 4.22–5.81)
RDW: 15.4 % (ref 11.5–15.5)
WBC: 9.2 10*3/uL (ref 4.0–10.5)
nRBC: 0 % (ref 0.0–0.2)

## 2018-08-15 LAB — PROTEIN S ACTIVITY: Protein S Activity: 63 % (ref 63–140)

## 2018-08-15 LAB — LUPUS ANTICOAGULANT PANEL
DRVVT: 47.8 s — ABNORMAL HIGH (ref 0.0–47.0)
PTT Lupus Anticoagulant: 37.2 s (ref 0.0–51.9)

## 2018-08-15 LAB — PROTEIN S, TOTAL: Protein S Ag, Total: 87 % (ref 60–150)

## 2018-08-15 LAB — ANTITHROMBIN III: AntiThromb III Func: 93 % (ref 75–120)

## 2018-08-15 LAB — DRVVT MIX: dRVVT Mix: 38.8 s (ref 0.0–47.0)

## 2018-08-15 LAB — PROTEIN C ACTIVITY: Protein C Activity: 129 % (ref 73–180)

## 2018-08-15 MED ORDER — APIXABAN 5 MG PO TABS: 10.0000 mg | ORAL_TABLET | Freq: Two times a day (BID) | ORAL | 2 refills | Status: DC

## 2018-08-15 MED ORDER — PREDNISONE 20 MG PO TABS
40.0000 mg | ORAL_TABLET | Freq: Every day | ORAL | Status: DC
  Administered 2018-08-15: 40 mg via ORAL
  Filled 2018-08-15: qty 2

## 2018-08-15 NOTE — Discharge Summary (Signed)
[facility] NAME: Jason Huerta    MR#:  [id number]  DATE OF BIRTH:  1960/07/10  DATE OF ADMISSION:  08/13/2018 ADMITTING PHYSICIAN: Christel Mormon, MD  DATE OF DISCHARGE: 08/15/2018  PRIMARY CARE PHYSICIAN: Guadalupe Maple, MD    ADMISSION DIAGNOSIS:  Acute pulmonary embolism, unspecified pulmonary embolism type, unspecified whether acute cor pulmonale present ([place]) [I26.99]  DISCHARGE DIAGNOSIS:  Acute Pulmonary embolism--now on oral anticoagulation Acute hypoxic respiratory failure with mild emphysematous changes on CXR with recent COVID -19 and severe viral pneumonitis--now on oxygen  SECONDARY DIAGNOSIS:   Past Medical History:  Diagnosis Date  . Hypertension   . Lichen planus     HOSPITAL COURSE:  Jason Huerta  is a 58 y.o. male with a known history of hypertension and a recent history of COVID-19 with acute respiratory distress syndrome.  Patient was originally hospitalized on 07/10/2022 acute respiratory distress associated with COVID-19.  He was discharged home on 07/29/2018.  Patient reports he has noticed recently increasing shortness of breath since his discharge from the hospital  1.Bilateral pulmonary embolism suspect this is related to post COVID hyper coagulable state -Patient was  started on heparin infusion now on Eliquis -doing well -Eliquis coupon given  2. Status post recent COVID-19 with acute hypoxic respiratory failure and mild emphyesma noted on CXR  -Patient with inflammatory changes in the lungs seen by Dr. Jamal Collin who recommends prednisone taper at discharge and oxygen arranged thru [facility]. -pt currently stable and eager to go home  3.hypertension  --Patient's blood pressure stable  4. dvt proph Eliquis  D/c to home  CONSULTS OBTAINED:    DRUG ALLERGIES:  No Known Allergies  DISCHARGE MEDICATIONS:   Allergies as of 08/15/2018   No Known Allergies     Medication  List    TAKE these medications   acetaminophen 325 MG tablet Commonly known as: TYLENOL Take 2 tablets (650 mg total) by mouth every 6 (six) hours as needed for mild pain or headache (fever >/= 101).   apixaban 5 MG Tabs tablet Commonly known as: ELIQUIS Take 2 tablets (10 mg total) by mouth 2 (two) times daily. And then take 5 mg (1 tab) bid from 08/21/2018   ascorbic acid 500 MG tablet Commonly known as: VITAMIN C Take 1 tablet (500 mg total) by mouth daily. Take for 10 days   predniSONE 10 MG tablet Commonly known as: DELTASONE Take 40 mg (4 tabs) daily for 5 days, then 30 mg (3 tabs) daily for 5 days, then 20 mg (2 tabs) daily for 5 days, then 10 mg daily until seen in follow-up   traZODone 100 MG tablet Commonly known as: DESYREL Take 1 tablet (100 mg total) by mouth at bedtime.   zinc sulfate 220 (50 Zn) MG capsule Take 1 capsule (220 mg total) by mouth daily. Take for 10 days       If you experience worsening of your admission symptoms, develop shortness of breath, life threatening emergency, suicidal or homicidal thoughts you must seek medical attention immediately by calling 911 or calling your MD immediately  if symptoms less severe.  You Must read complete instructions/literature along with all the possible adverse reactions/side effects for all the Medicines you take and that have been prescribed to you. Take any new Medicines after you have completely understood and accept all the possible adverse reactions/side effects.   Please note  You were cared for by  a hospitalist during your hospital stay. If you have any questions about your discharge medications or the care you received while you were in the hospital after you are discharged, you can call the unit and asked to speak with the hospitalist on call if the hospitalist that took care of you is not available. Once you are discharged, your primary care physician will handle any further medical issues. Please note that  NO REFILLS for any discharge medications will be authorized once you are discharged, as it is imperative that you return to your primary care physician (or establish a relationship with a primary care physician if you do not have one) for your aftercare needs so that they can reassess your need for medications and monitor your lab values. Today   SUBJECTIVE   No new complaints Wants to go home  VITAL SIGNS:  Blood pressure 121/81, pulse 93, temperature 97.9 F (36.6 C), temperature source Oral, resp. rate 19, height  (1.803 m), weight 106 kg, SpO2 90 %.  I/O:    Intake/Output Summary (Last 24 hours) at 08/15/2018 0757 Last data filed at 08/14/2018 2219 Gross per 24 hour  Intake 480 ml  Output 0 ml  Net 480 ml    PHYSICAL EXAMINATION:  GENERAL:  58 y.o.-year-old patient lying in the bed with no acute distress.  EYES: Pupils equal, round, reactive to light and accommodation. No scleral icterus. Extraocular muscles intact.  HEENT: Head atraumatic, normocephalic. Oropharynx and nasopharynx clear.  NECK:  Supple, no jugular venous distention. No thyroid enlargement, no tenderness.  LUNGS: distant breath sounds bilaterally, no wheezing, rales,rhonchi or crepitation. No use of accessory muscles of respiration.  CARDIOVASCULAR: S1, S2 normal. No murmurs, rubs, or gallops.  ABDOMEN: Soft, non-tender, non-distended. Bowel sounds present. No organomegaly or mass.  EXTREMITIES: No pedal edema, cyanosis, or clubbing.  NEUROLOGIC: Cranial nerves II through XII are intact. Muscle strength 5/5 in all extremities. Sensation intact. Gait not checked.  PSYCHIATRIC: The patient is alert and oriented x 3.  SKIN: No obvious rash, lesion, or ulcer.   DATA REVIEW:   CBC  Recent Labs  Lab 08/15/18 0643  WBC 9.2  HGB 13.4  HCT 38.9*  PLT 135*    Chemistries  Recent Labs  Lab 08/13/18 1612 08/14/18 0541  NA 137 140  K 4.0 3.9  CL 105 109  CO2 20* 27  GLUCOSE 162* 121*  BUN 9 10   CREATININE 0.93 0.90  CALCIUM 8.6* 8.3*  AST 30  --   ALT 28  --   ALKPHOS 77  --   BILITOT 1.1  --     Microbiology Results   Recent Results (from the past 240 hour(s))  SARS Coronavirus 2 (CEPHEID - Performed in [facility] hospital lab), Hosp Order     Status: None   Collection Time: 08/13/18  6:08 PM   Specimen: Nasopharyngeal Swab  Result Value Ref Range Status   SARS Coronavirus 2 NEGATIVE NEGATIVE Final    Comment: (NOTE) If result is NEGATIVE SARS-CoV-2 target nucleic acids are NOT DETECTED. The SARS-CoV-2 RNA is generally detectable in upper and lower  respiratory specimens during the acute phase of infection. The lowest  concentration of SARS-CoV-2 viral copies this assay can detect is 250  copies / mL. A negative result does not preclude SARS-CoV-2 infection  and should not be used as the sole basis for treatment or other  patient management decisions.  A negative result may occur with  improper specimen collection /  handling, submission of specimen other  than nasopharyngeal swab, presence of viral mutation(s) within the  areas targeted by this assay, and inadequate number of viral copies  (<250 copies / mL). A negative result must be combined with clinical  observations, patient history, and epidemiological information. If result is POSITIVE SARS-CoV-2 target nucleic acids are DETECTED. The SARS-CoV-2 RNA is generally detectable in upper and lower  respiratory specimens dur ing the acute phase of infection.  Positive  results are indicative of active infection with SARS-CoV-2.  Clinical  correlation with patient history and other diagnostic information is  necessary to determine patient infection status.  Positive results do  not rule out bacterial infection or co-infection with other viruses. If result is PRESUMPTIVE POSTIVE SARS-CoV-2 nucleic acids MAY BE PRESENT.   A presumptive positive result was obtained on the submitted specimen  and confirmed on repeat  testing.  While 2019 novel coronavirus  (SARS-CoV-2) nucleic acids may be present in the submitted sample  additional confirmatory testing may be necessary for epidemiological  and / or clinical management purposes  to differentiate between  SARS-CoV-2 and other Sarbecovirus currently known to infect humans.  If clinically indicated additional testing with an alternate test  methodology ([id number]) is advised. The SARS-CoV-2 RNA is generally  detectable in upper and lower respiratory sp ecimens during the acute  phase of infection. The expected result is Negative. Fact Sheet for Patients:  https://www.fda.gov/media/136312/download Fact Sheet for Healthcare Providers: https://www.fda.gov/media/136313/download This test is not yet approved or cleared by the [place] FDA and has been authorized for detection and/or diagnosis of SARS-CoV-2 by FDA under an Emergency Use Authorization (EUA).  This EUA will remain in effect (meaning this test can be used) for the duration of the COVID-19 declaration under Section 564(b)(1) of the Act, 21 U.S.C. section 360bbb-3(b)(1), unless the authorization is terminated or revoked sooner. Performed at  [facility], [address] [place], [place] [address]     RADIOLOGY:  Dg Chest 2 View  Result Date: 08/13/2018 CLINICAL DATA:  Worsening shortness of breath. Recent COVID-19 diagnosis. EXAM: CHEST - 2 VIEW COMPARISON:  Chest x-ray dated July 28, 2018. FINDINGS: The heart size and mediastinal contours are within normal limits. Worsened patchy bilateral interstitial and airspace opacities. No pleural effusion or pneumothorax. No acute osseous abnormality. IMPRESSION: 1. Worsened patchy bilateral interstitial and airspace opacities, consistent with history of COVID-19. Electronically Signed   By: William T Derry M.D.   On: 08/13/2018 15:04   Ct Angio Chest Pe W Or Wo Contrast  Result Date: 08/13/2018 CLINICAL DATA:  Shortness of breath, positive  COVID-19 EXAM: CT ANGIOGRAPHY CHEST WITH CONTRAST TECHNIQUE: Multidetector CT imaging of the chest was performed using the standard protocol during bolus administration of intravenous contrast. Multiplanar CT image reconstructions and MIPs were obtained to evaluate the vascular anatomy. CONTRAST:  39mLJeannKo16Marylene Land71m15JeannKorea16Marylene Land(32m41JeannKoreaA16Marylene Land36m65JeannKoreaVirgi16Marylene Land7m86JeannKo16Marylene Land49m37JeannKor16Marylene La46md3JeannKoreaCypres16Marylene Land(20m63JeannKoreaMaka16Marylene Land17mKore16Marylene L76mndJeannKorea16Marylene La32md4JeannKore16Marylene Land(26m47JeannKoreaCh16Marylene [phone] Raspberry0 MG/ML SOLN COMPARISON:  None. FINDINGS: Cardiovascular: Satisfactory opacification of the pulmonary arteries to the segmental level. Positive examination for pulmonary embolism with segmental to subsegmental embolus present bilaterally, for example in the right lower lobe (series 7, image 82) and in the left upper lobe (series 7, image 53). Slightly increased RV LV ratio, 1.0. Three-vessel coronary artery calcifications. No pericardial effusion. Mediastinum/Nodes: No enlarged mediastinal, hilar, or axillary lymph nodes. Thyroid gland, trachea, and esophagus demonstrate no significant findings. Lungs/Pleura: Moderate centrilobular emphysema. There is extensive, predominantly ground-glass pulmonary opacity, much of which is subpleural in distribution. No pleural effusion or pneumothorax. Upper Abdomen: No acute abnormality.  Musculoskeletal: No chest wall abnormality. No acute or significant osseous findings. Review of the MIP images confirms the above findings. IMPRESSION: 1. Positive examination for pulmonary embolism with segmental to subsegmental embolus present bilaterally, for example in the right lower lobe (series 7, image 82) and in the left upper lobe (series 7, image 53). 2. Slightly increased RV LV ratio, 1.0. Correlate with echocardiographic findings for evidence of right heart strain. 3. There is extensive, predominantly ground-glass pulmonary opacity, much of which is subpleural in distribution. There are a spectrum of findings in the lungs which can be seen with acute atypical infection (as well as other non-infectious etiologies). In particular, viral pneumonia (including COVID-19)  should be considered in the appropriate clinical setting. 4.  Emphysema. 5.  Coronary artery disease. Physician on-call paged to report results at the time of this dictation. Electronically Signed   By: Lauralyn PrimesAlex  Bibbey M.D.   On: 08/13/2018 15:13   [place] Venous Img Lower Bilateral  Result Date: 08/13/2018 CLINICAL DATA:  Pulmonary embolism. EXAM: BILATERAL LOWER EXTREMITY VENOUS DOPPLER ULTRASOUND TECHNIQUE: Gray-scale sonography with graded compression, as well as color Doppler and duplex ultrasound were performed to evaluate the lower extremity deep venous systems from the level of the common femoral vein and including the common femoral, femoral, profunda femoral, popliteal and calf veins including the posterior tibial, peroneal and gastrocnemius veins when visible. The superficial great saphenous vein was also interrogated. Spectral Doppler was utilized to evaluate flow at rest and with distal augmentation maneuvers in the common femoral, femoral and popliteal veins. COMPARISON:  None. FINDINGS: RIGHT LOWER EXTREMITY Common Femoral Vein: No evidence of thrombus. Normal compressibility, respiratory phasicity and response to augmentation. Saphenofemoral Junction: No evidence of thrombus. Normal compressibility and flow on color Doppler imaging. Profunda Femoral Vein: No evidence of thrombus. Normal compressibility and flow on color Doppler imaging. Femoral Vein: No evidence of thrombus. Normal compressibility, respiratory phasicity and response to augmentation. Popliteal Vein: No evidence of thrombus. Normal compressibility, respiratory phasicity and response to augmentation. Calf Veins: No evidence of thrombus. Normal compressibility and flow on color Doppler imaging. Superficial Great Saphenous Vein: No evidence of thrombus. Normal compressibility. Venous Reflux:  None. Other Findings: No evidence of superficial thrombophlebitis or abnormal fluid collection. LEFT LOWER EXTREMITY Common Femoral Vein: No evidence of  thrombus. Normal compressibility, respiratory phasicity and response to augmentation. Saphenofemoral Junction: No evidence of thrombus. Normal compressibility and flow on color Doppler imaging. Profunda Femoral Vein: No evidence of thrombus. Normal compressibility and flow on color Doppler imaging. Femoral Vein: No evidence of thrombus. Normal compressibility, respiratory phasicity and response to augmentation. Popliteal Vein: No evidence of thrombus. Normal compressibility, respiratory phasicity and response to augmentation. Calf Veins: No evidence of thrombus. Normal compressibility and flow on color Doppler imaging. Superficial Great Saphenous Vein: No evidence of thrombus. Normal compressibility. Venous Reflux:  None. Other Findings: No evidence of superficial thrombophlebitis or abnormal fluid collection. IMPRESSION: No evidence of deep venous thrombosis in either lower extremity. Electronically Signed   By: Irish LackGlenn  Yamagata M.D.   On: 08/13/2018 17:40     CODE STATUS:     Code Status Orders  (From admission, onward)         Start     Ordered   08/13/18 2245  Full code  Continuous     08/13/18 2246        Code Status History    Date Active Date Inactive Code Status Order ID Comments User Context   07/21/2018 2239 07/29/2018 1805  Full Code [id number]  Tyrone NineGrunz, Ryan B, MD Inpatient   Advance Care Planning Activity      TOTAL TIME TAKING CARE OF THIS PATIENT: *40* minutes.    Enedina FinnerSona Evelette Hollern M.D on 08/15/2018 at 7:57 AM  Between 7am to 6pm - Pager - [phone] After 6pm go to [email or web address] - Social research officer, governmentpassword EPAS [email or web address]  [facility]  Office  [phone]  CC: Primary care physician; Steele Sizerrissman, Mark A, MD

## 2018-08-15 NOTE — Discharge Instructions (Signed)
Oxygen to be delivered today (per pt)--Dr simmons office has arranged for it. Pt to use per instructions.

## 2018-08-16 LAB — BETA-2-GLYCOPROTEIN I ABS, IGG/M/A
Beta-2 Glyco I IgG: <9 GPI IgG units (ref 0–20)
Beta-2-Glycoprotein I IgA: <9 GPI IgA units (ref 0–25)
Beta-2-Glycoprotein I IgM: <9 GPI IgM units (ref 0–32)

## 2018-08-16 LAB — CARDIOLIPIN ANTIBODIES, IGG, IGM, IGA
Anticardiolipin IgA: <9 APL U/mL (ref 0–11)
Anticardiolipin IgG: 10 GPL U/mL (ref 0–14)
Anticardiolipin IgM: <9 MPL U/mL (ref 0–12)

## 2018-08-16 LAB — PROTEIN C, TOTAL: Protein C, Total: 102 % (ref 60–150)

## 2018-08-17 LAB — HOMOCYSTEINE: Homocysteine: 7.9 umol/L (ref 0.0–14.5)

## 2018-08-20 LAB — PROTHROMBIN GENE MUTATION

## 2018-08-20 LAB — FACTOR 5 LEIDEN

## 2018-09-08 ENCOUNTER — Telehealth: Payer: Self-pay | Admitting: Pulmonary Disease

## 2018-09-08 NOTE — Telephone Encounter (Signed)
Called patient for COVID-19 pre-screening for in office visit.  Have you recently traveled any where out of the local area in the last 2 weeks? no  Have you been in close contact with a person diagnosed with COVID-19 or someone awaiting results within the last 2 weeks? no  Do you currently have any of the following symptoms? If so, when did they start? Cough     Diarrhea   Joint Pain Fever      Muscle Pain   Red eyes Shortness of breath-present x1-42mo   Abdominal pain  Vomiting Loss of smell    Rash    Sore Throat Headache    Weakness   Bruising or bleeding   Okay to proceed with visit.

## 2018-09-10 ENCOUNTER — Encounter: Payer: Self-pay | Admitting: Pulmonary Disease

## 2018-09-10 ENCOUNTER — Ambulatory Visit: Payer: BC Managed Care – PPO | Admitting: Pulmonary Disease

## 2018-09-10 ENCOUNTER — Other Ambulatory Visit: Payer: Self-pay

## 2018-09-10 ENCOUNTER — Ambulatory Visit
Admission: RE | Admit: 2018-09-10 | Discharge: 2018-09-10 | Disposition: A | Discharge status: Home / Self Care | Payer: BC Managed Care – PPO | Source: Ambulatory Visit | Attending: Pulmonary Disease | Admitting: Pulmonary Disease

## 2018-09-10 VITALS — BP 150/96 | HR 97 | Temp 98.1°F | Ht 71.0 in | Wt 237.6 lb

## 2018-09-10 DIAGNOSIS — J1289 Other viral pneumonia: Secondary | ICD-10-CM | POA: Insufficient documentation

## 2018-09-10 DIAGNOSIS — U071 COVID-19: Secondary | ICD-10-CM | POA: Insufficient documentation

## 2018-09-10 DIAGNOSIS — Z87891 Personal history of nicotine dependence: Secondary | ICD-10-CM | POA: Diagnosis not present

## 2018-09-10 DIAGNOSIS — I517 Cardiomegaly: Secondary | ICD-10-CM | POA: Diagnosis not present

## 2018-09-10 DIAGNOSIS — J432 Centrilobular emphysema: Secondary | ICD-10-CM

## 2018-09-10 DIAGNOSIS — R911 Solitary pulmonary nodule: Secondary | ICD-10-CM

## 2018-09-10 DIAGNOSIS — R918 Other nonspecific abnormal finding of lung field: Secondary | ICD-10-CM | POA: Diagnosis not present

## 2018-09-10 DIAGNOSIS — I2694 Multiple subsegmental pulmonary emboli without acute cor pulmonale: Secondary | ICD-10-CM

## 2018-09-10 DIAGNOSIS — J1282 Pneumonia due to coronavirus disease 2019: Secondary | ICD-10-CM

## 2018-09-10 NOTE — Patient Instructions (Signed)
We will contact your oxygen provider to have them remove the oxygen from your home Continue Eliquis for a total of 6 months (through mid December 2020) Chest x-ray ordered for today I will review your CT scan with radiologist and we will discuss whether any further evaluation is necessary for the possible nodule seen Follow-up in 4-5 months.  Call sooner if needed  You may return to work with full duties September 26, 2018

## 2018-09-10 NOTE — Progress Notes (Signed)
PULMONARY OFFICE FOLLOW-UP NOTE  Requesting MD/Service: Jeananne Rama Date of initial consultation: 08/13/18 Reason for consultation: Recent COVID-19. Persistent severe dyspnea  PT PROFILE: 58 y.o. male former smoker employed at [employer] facility where there was a recent COVID-19 outbreak.  He was hospitalized 5/26-07/29/18.  He did not require intubation but he was on high flow nasal cannula oxygen (at one point 15 LPM).  He was treated with Remdesivir, Tocilizumab, dexamethasone and ultimately with convalescent plasma.  He was not discharged to home on oxygen therapy and steroid therapy was discontinued at the time of discharge  DATA: 08/13/18 CT chest: Mild-moderate emphysema.  Positive pulmonary embolism with segmental to subsegmental embolus present bilaterally.  Extensive, predominantly ground-glass pulmonary opacity, much of which is subpleural in distribution. LUL nodule incidentally noted 08/13/18 BLE venous US: No DVT noted  INTERVAL: Initial evaluation was 08/13/2018.  At that time diagnosis of pulmonary embolism was made and he was briefly hospitalized, discharged 08/15/2018.  No major events since discharge.  SUBJ:  Since discharge, he has progressively improved and is almost back to his prior baseline.  He is no longer wearing oxygen.  He has no new complaints.  He denies pleuritic chest pain, cough, sputum production, hemoptysis, lower extremity edema, calf tenderness.   Vitals:   09/10/18 1134  BP: (!) 150/96  Pulse: 97  Temp: 98.1 F (36.7 C)  TempSrc: Skin  SpO2: 95%  Weight: 237 lb 9.6 oz (107.8 kg)  Height: 5\' 11"  (1.803 m)  Room air  EXAM:  Gen: NAD HEENT: NCAT, sclerae white Neck: No JVD Lungs: breath sounds full, no wheezes or other adventitious sounds Cardiovascular: RRR, no murmurs Abdomen: Soft, nontender, normal BS Ext: without clubbing, cyanosis, edema Neuro: grossly intact Skin: Limited exam, no lesions noted   DATA:   BMP Latest Ref  Rng & Units 08/14/2018 08/13/2018 08/13/2018  Glucose 70 - 99 mg/dL 121(H) 162(H) 125(H)  BUN 6 - 20 mg/dL 10 9 8   Creatinine 0.61 - 1.24 mg/dL 0.90 0.93 0.82  BUN/Creat Ratio 9 - 20 - - -  Sodium 135 - 145 mmol/L 140 137 141  Potassium 3.5 - 5.1 mmol/L 3.9 4.0 3.8  Chloride 98 - 111 mmol/L 109 105 109  CO2 22 - 32 mmol/L 27 20(L) 24  Calcium 8.9 - 10.3 mg/dL 8.3(L) 8.6(L) 9.0    CBC Latest Ref Rng & Units 08/15/2018 08/14/2018 08/13/2018  WBC 4.0 - 10.5 K/uL 9.2 6.3 7.0  Hemoglobin 13.0 - 17.0 g/dL 13.4 13.3 14.1  Hematocrit 39.0 - 52.0 % 38.9(L) 37.9(L) 40.8  Platelets 150 - 400 K/uL 135(L) 116(L) 138(L)    CXR 09/10/18: Normal or nearly normal with marked improvement in previously noted bilateral pulmonary infiltrate  I have personally reviewed all chest radiographs reported above including CXRs and CT chest unless otherwise indicated  IMPRESSION:     ICD-10-CM   1. Recent pneumonia due to COVID-19 virus  U07.1 DG Chest 2 View   J12.89   2. Recent diagnosis of multiple subsegmental pulmonary emboli without acute cor pulmonale  I26.94   3. Centrilobular emphysema (HCC)  J43.2 AMB REFERRAL FOR DME  4. Former smoker  Z87.891   5. Incidental finding of solitary LUL nodule  R91.1    PLAN:  1) chest x-ray ordered for today and reviewed above 2) continue Eliquis for total 6 months (through mid December 2020) 3) I have reviewed CT scan with radiologist who agrees with the finding of LUL nodule and recommends repeat CT  scan in 6 months.  This will need to be ordered upon follow-up 4) follow-up in 4-5 months with any available provider.  Call sooner if needed  He may return to work with full duties on September 26, 2018  Billy Fischeravid Mariska Daffin, MD PCCM service Mobile [phone] Pager [phone] 09/10/2018 4:53 PM

## 2018-09-11 ENCOUNTER — Telehealth: Payer: Self-pay | Admitting: Pulmonary Disease

## 2018-09-11 MED ORDER — APIXABAN 5 MG PO TABS: ORAL_TABLET | ORAL | 2 refills | Status: AC

## 2018-09-11 NOTE — Telephone Encounter (Signed)
Contacted pt and advised that his Eloquis was prescribed 08/15/2018 with 2 additional refills. Instructed pt to contact pharmacy to get that refill. Pt stated that he just picked it up and they did not give him the 74 tablets that the discount card stated he would get. Explained  pt that the 74 tablets is a starter pack and he is no longer on the starting dosage. Pt verbalized understanding. Pt requested new refills to be sent in to be put on hold since he is to stay on it for a few months.Advised we will send new rx in. No further actions necessary at this time.  Prescription sent.

## 2018-09-14 DIAGNOSIS — I10 Essential (primary) hypertension: Secondary | ICD-10-CM | POA: Diagnosis not present

## 2018-09-14 DIAGNOSIS — U071 COVID-19: Secondary | ICD-10-CM | POA: Diagnosis not present

## 2018-09-24 ENCOUNTER — Telehealth: Payer: Self-pay | Admitting: Pulmonary Disease

## 2018-09-24 NOTE — Telephone Encounter (Signed)
Called patient back, he may return to work Monday August 3rd, with no restrictions. Will fax to: Andi Hence at [phone].

## 2018-10-16 ENCOUNTER — Other Ambulatory Visit: Payer: Self-pay | Admitting: Family Medicine

## 2018-10-16 NOTE — Telephone Encounter (Signed)
Requested medication (s) are due for refill today:   Yes  Requested medication (s) are on the active medication list:   Yes  Future visit scheduled:   No   Been a year since he was seen other than an acute OV   Last ordered: 03/12/2018  #30  2 refills   Requested Prescriptions  Pending Prescriptions Disp Refills   traZODone (DESYREL) 100 MG tablet [Pharmacy Med Name: TRAZODONE HCL 100 MG TAB] 30 tablet 2    Sig: TAKE ONE TABLET BY MOUTH AT BEDTIME     Psychiatry: Antidepressants - Serotonin Modulator Failed - 10/16/2018  1:38 PM      Failed - Completed PHQ-2 or PHQ-9 in the last 360 days.      Passed - Valid encounter within last 6 months    Recent Outpatient Visits          2 months ago Acute respiratory disease due to COVID-19 virus   [facility] [place], Jeannette How, MD   1 year ago Essential hypertension   [place], Jeannette How, MD   2 years ago Upper respiratory infection   [place], [place], [place]   3 years ago Essential hypertension   [place], [place], DO

## 2019-03-31 DIAGNOSIS — M545 Low back pain: Secondary | ICD-10-CM | POA: Diagnosis not present

## 2019-03-31 DIAGNOSIS — M9903 Segmental and somatic dysfunction of lumbar region: Secondary | ICD-10-CM | POA: Diagnosis not present

## 2019-03-31 DIAGNOSIS — M25561 Pain in right knee: Secondary | ICD-10-CM | POA: Diagnosis not present

## 2019-03-31 DIAGNOSIS — M9904 Segmental and somatic dysfunction of sacral region: Secondary | ICD-10-CM | POA: Diagnosis not present

## 2019-08-08 IMAGING — CT CT ANGIOGRAPHY CHEST
2 of 8 series · 12 of 36 positions shown · IV contrast (omnipaque)
Comparison: None.
COMPARISON: None.

Addendum:
CLINICAL DATA: Shortness of breath, positive XVXC8-ED

EXAM:
CT ANGIOGRAPHY CHEST WITH CONTRAST
TECHNIQUE: Multidetector CT imaging of the chest was performed using the
standard protocol during bolus administration of intravenous
contrast. Multiplanar CT image reconstructions and MIPs were
obtained to evaluate the vascular anatomy.
CONTRAST:  75mL OMNIPAQUE IOHEXOL 350 MG/ML SOLN

[Series 7: cta pe · axial · 0.81mm/px · z∈[-1179,-911]mm · 11 of 162 slices shown]
[im 14/162  lung]
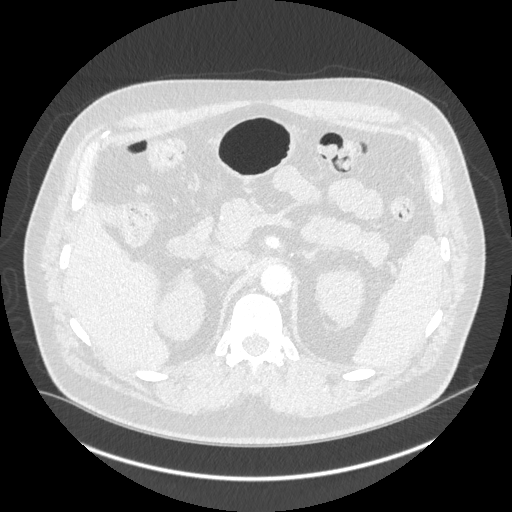
[im 27/162  mediastinal]
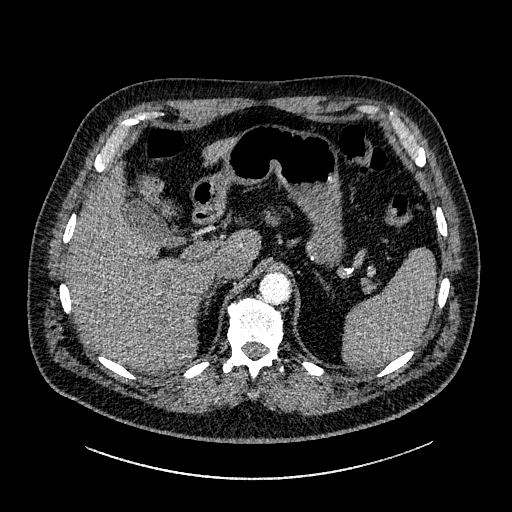
[im 41/162  lung]
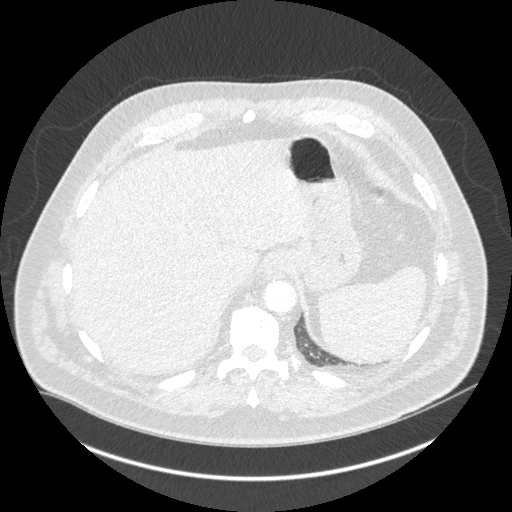
[im 54/162  mediastinal]
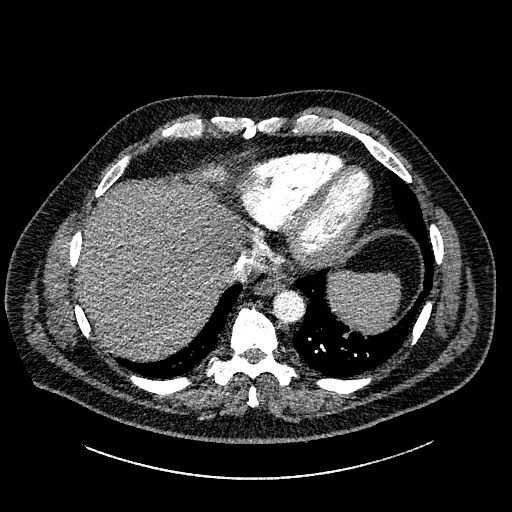
[im 68/162  lung]
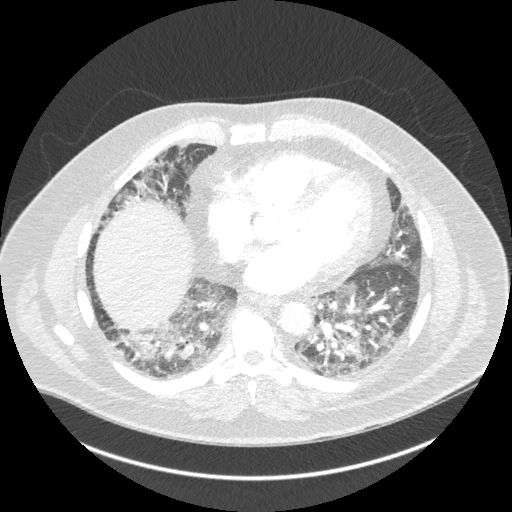
[im 81/162  mediastinal]
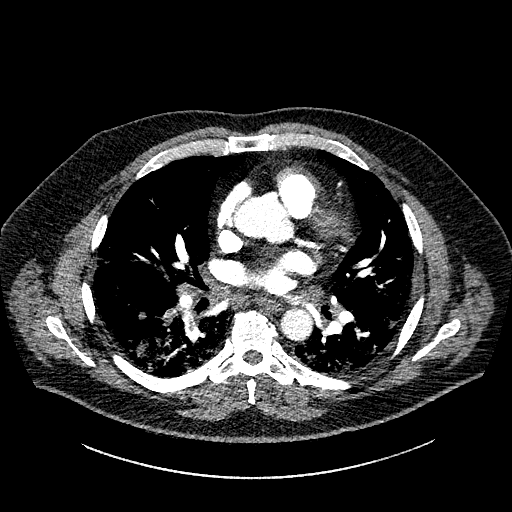
[im 94/162  lung]
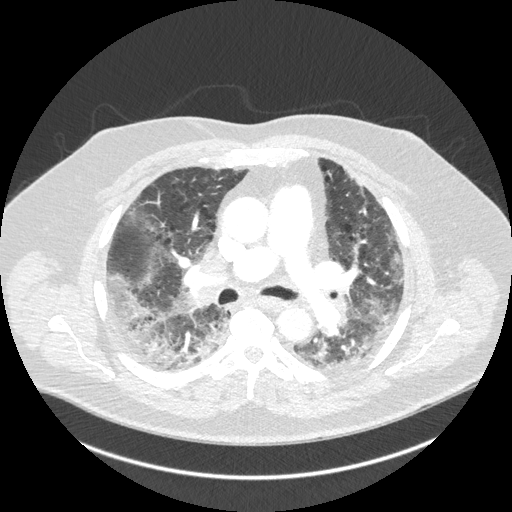
[im 108/162  mediastinal]
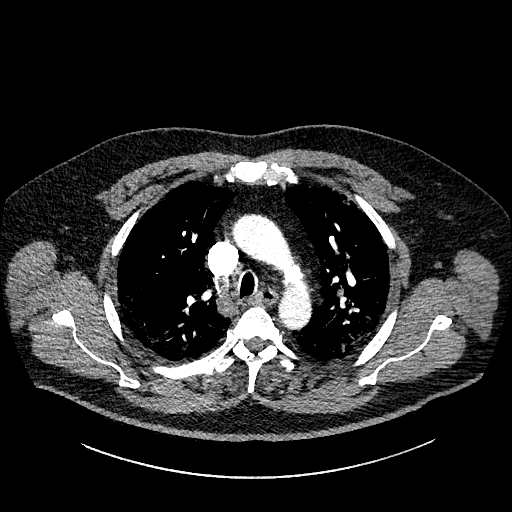
[im 121/162  lung]
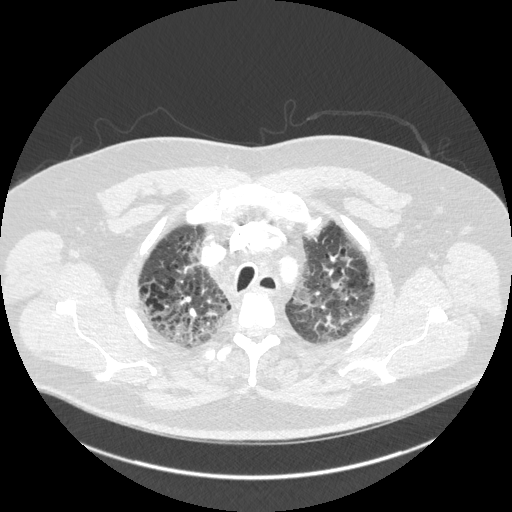
[im 135/162  mediastinal]
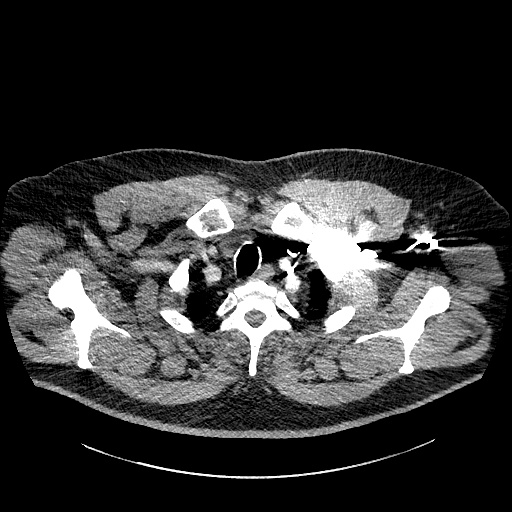
[im 148/162  lung]
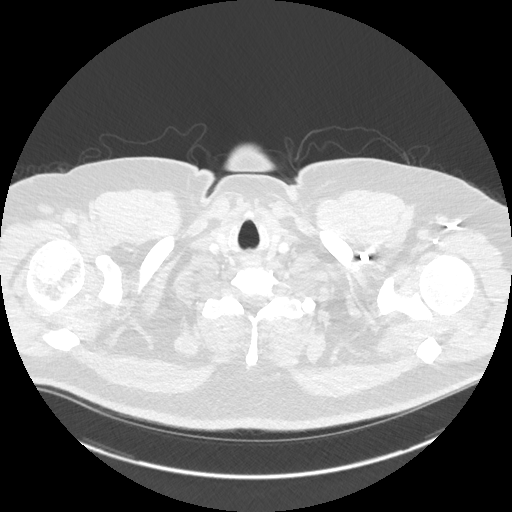

[Series 10: coronal cta pe · coronal · 0.63mm/px · 1 of 150 slices shown]
[im 75/150  mediastinal]
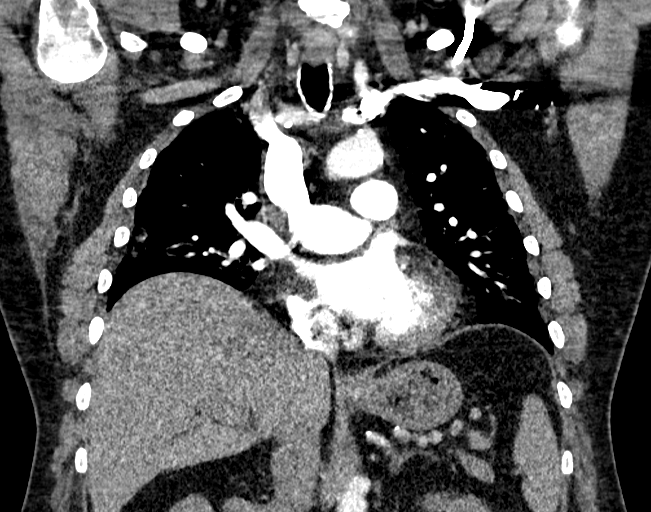

[12 of 36 positions shown; findings below may reference images not displayed]

FINDINGS: Cardiovascular: Satisfactory opacification of the pulmonary arteries
to the segmental level. Positive examination for pulmonary embolism
with segmental to subsegmental embolus present bilaterally, for
example in the right lower lobe (series 7, image 82) and in the left
upper lobe (series 7, image 53). Slightly increased RV LV ratio,
1.0. Three-vessel coronary artery calcifications. No pericardial
effusion.

Mediastinum/Nodes: No enlarged mediastinal, hilar, or axillary lymph
nodes. Thyroid gland, trachea, and esophagus demonstrate no
significant findings.

Lungs/Pleura: Moderate centrilobular emphysema. There is extensive,
predominantly ground-glass pulmonary opacity, much of which is
subpleural in distribution. No pleural effusion or pneumothorax.

Upper Abdomen: No acute abnormality.

Musculoskeletal: No chest wall abnormality. No acute or significant
osseous findings.

Review of the MIP images confirms the above findings.
IMPRESSION: 1. Positive examination for pulmonary embolism with segmental to
subsegmental embolus present bilaterally, for example in the right
lower lobe (series 7, image 82) and in the left upper lobe (series
7, image 53).

2. Slightly increased RV LV ratio, 1.0. Correlate with
echocardiographic findings for evidence of right heart strain.

3. There is extensive, predominantly ground-glass pulmonary opacity,
much of which is subpleural in distribution. There are a spectrum of
findings in the lungs which can be seen with acute atypical
infection (as well as other non-infectious etiologies). In
particular, viral pneumonia (including XVXC8-ED) should be
considered in the appropriate clinical setting.

4.  Emphysema.

5.  Coronary artery disease.

Physician on-call paged to report results at the time of this
dictation.

ADDENDUM:
Addendum is made to note the presence of a 7-8 mm nodule of the
peripheral left upper lobe (series 5, image 84). Recommend CT
follow-up of this indeterminate nodule in 3-6 months to ensure
stability.

Findings discussed by telephone with Dr. Ourari at 5 p.m.,
09/10/2018.

*** End of Addendum ***
FINDINGS: Cardiovascular: Satisfactory opacification of the pulmonary arteries
to the segmental level. Positive examination for pulmonary embolism
with segmental to subsegmental embolus present bilaterally, for
example in the right lower lobe (series 7, image 82) and in the left
upper lobe (series 7, image 53). Slightly increased RV LV ratio,
1.0. Three-vessel coronary artery calcifications. No pericardial
effusion.

Mediastinum/Nodes: No enlarged mediastinal, hilar, or axillary lymph
nodes. Thyroid gland, trachea, and esophagus demonstrate no
significant findings.

Lungs/Pleura: Moderate centrilobular emphysema. There is extensive,
predominantly ground-glass pulmonary opacity, much of which is
subpleural in distribution. No pleural effusion or pneumothorax.

Upper Abdomen: No acute abnormality.

Musculoskeletal: No chest wall abnormality. No acute or significant
osseous findings.

Review of the MIP images confirms the above findings.
IMPRESSION: 1. Positive examination for pulmonary embolism with segmental to
subsegmental embolus present bilaterally, for example in the right
lower lobe (series 7, image 82) and in the left upper lobe (series
7, image 53).

2. Slightly increased RV LV ratio, 1.0. Correlate with
echocardiographic findings for evidence of right heart strain.

3. There is extensive, predominantly ground-glass pulmonary opacity,
much of which is subpleural in distribution. There are a spectrum of
findings in the lungs which can be seen with acute atypical
infection (as well as other non-infectious etiologies). In
particular, viral pneumonia (including XVXC8-ED) should be
considered in the appropriate clinical setting.

4.  Emphysema.

5.  Coronary artery disease.

Physician on-call paged to report results at the time of this
dictation.

## 2019-08-12 DIAGNOSIS — N529 Male erectile dysfunction, unspecified: Secondary | ICD-10-CM | POA: Diagnosis not present

## 2019-08-12 DIAGNOSIS — F5101 Primary insomnia: Secondary | ICD-10-CM | POA: Diagnosis not present

## 2019-08-12 DIAGNOSIS — Z0000 Encounter for general adult medical examination without abnormal findings: Secondary | ICD-10-CM | POA: Diagnosis not present

## 2019-08-12 DIAGNOSIS — I10 Essential (primary) hypertension: Secondary | ICD-10-CM | POA: Diagnosis not present

## 2019-09-05 IMAGING — CR CHEST - 2 VIEW
1 series · 2 of 2 positions shown · non-contrast
Comparison: 08/13/2018

CLINICAL DATA: Follow-up [id number]

EXAM:
CHEST - 2 VIEW

[Series 1: w chest pa · 0.14mm/px · 2 of 2 slices shown]
[im 1/2]
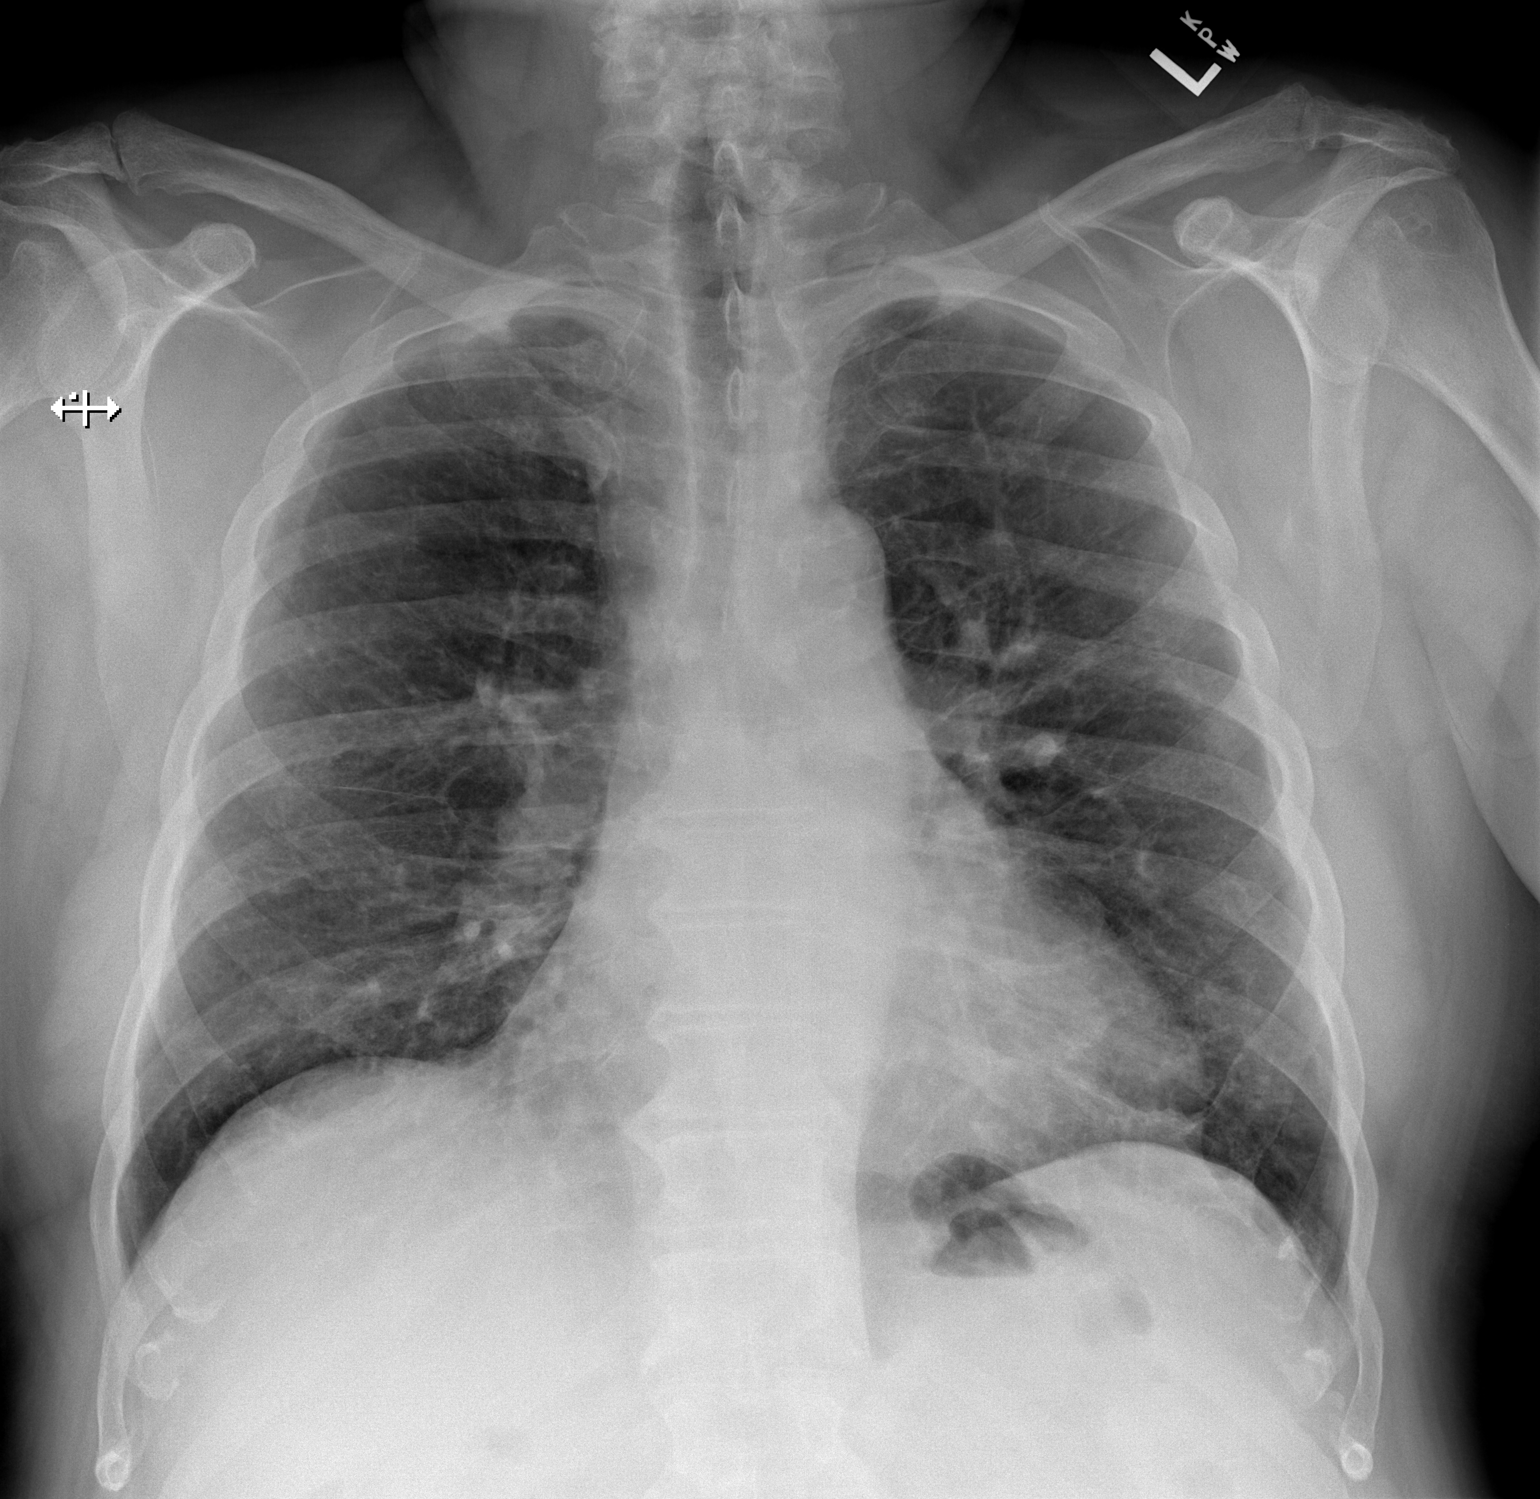
[im 2/2]
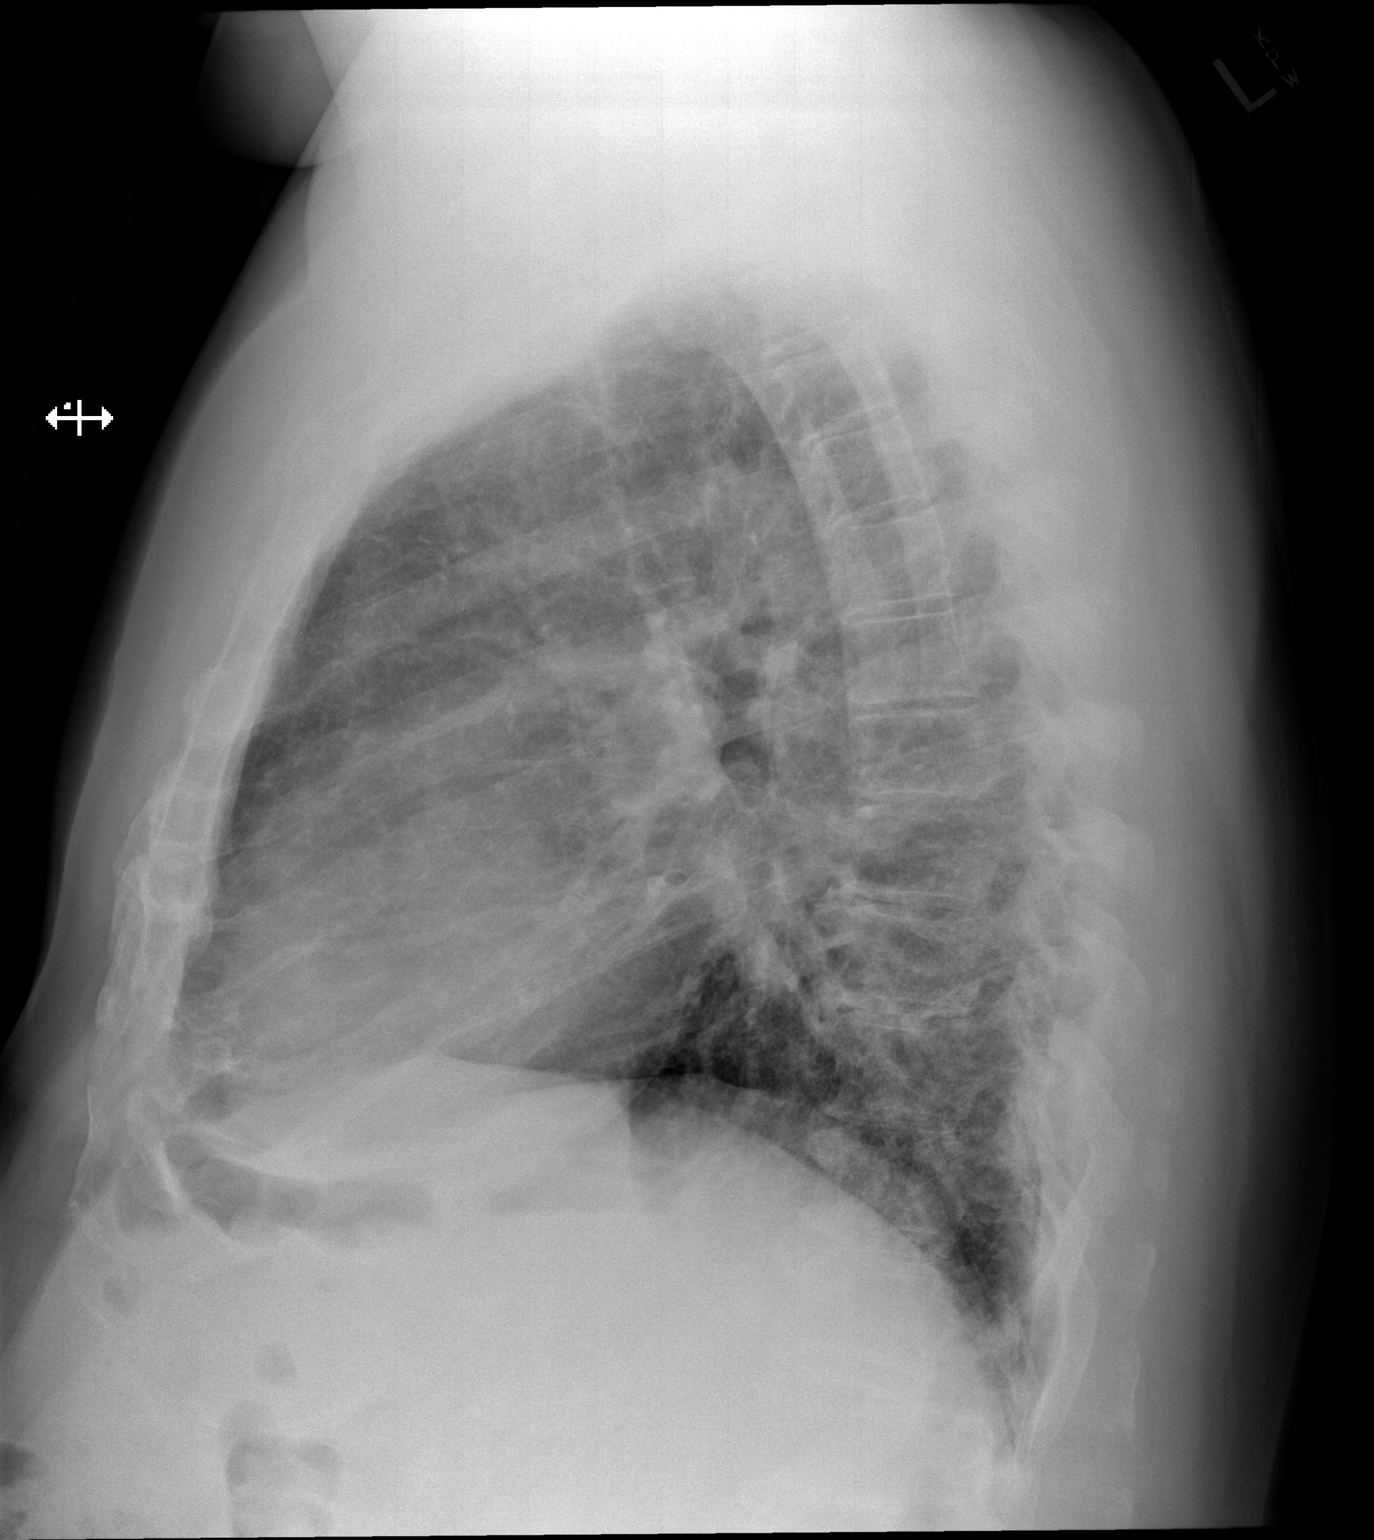

[2 of 2 positions shown; findings below may reference images not displayed]

FINDINGS: Interval improvement in previously seen diffuse bilateral,
predominantly peripheral ground-glass and heterogeneous pulmonary
opacity, which is persistent although minimal. There is no new
airspace opacity. Mild cardiomegaly.
IMPRESSION: Interval improvement in previously seen diffuse bilateral,
predominantly peripheral ground-glass and heterogeneous pulmonary
opacity, which is persistent although minimal. There is no new
airspace opacity. Mild cardiomegaly.

## 2019-09-30 DIAGNOSIS — F5101 Primary insomnia: Secondary | ICD-10-CM | POA: Diagnosis not present

## 2019-11-05 DIAGNOSIS — Z13220 Encounter for screening for lipoid disorders: Secondary | ICD-10-CM | POA: Diagnosis not present

## 2019-11-05 DIAGNOSIS — Z131 Encounter for screening for diabetes mellitus: Secondary | ICD-10-CM | POA: Diagnosis not present

## 2019-11-05 DIAGNOSIS — Z1389 Encounter for screening for other disorder: Secondary | ICD-10-CM | POA: Diagnosis not present

## 2019-11-05 DIAGNOSIS — Z0000 Encounter for general adult medical examination without abnormal findings: Secondary | ICD-10-CM | POA: Diagnosis not present

## 2019-11-12 DIAGNOSIS — Z0000 Encounter for general adult medical examination without abnormal findings: Secondary | ICD-10-CM | POA: Diagnosis not present

## 2019-11-12 DIAGNOSIS — E119 Type 2 diabetes mellitus without complications: Secondary | ICD-10-CM | POA: Diagnosis not present

## 2019-11-12 DIAGNOSIS — F5101 Primary insomnia: Secondary | ICD-10-CM | POA: Diagnosis not present

## 2019-11-12 DIAGNOSIS — I10 Essential (primary) hypertension: Secondary | ICD-10-CM | POA: Diagnosis not present

## 2019-11-12 DIAGNOSIS — Z125 Encounter for screening for malignant neoplasm of prostate: Secondary | ICD-10-CM | POA: Diagnosis not present

## 2021-03-07 ENCOUNTER — Ambulatory Visit: Payer: BC Managed Care – PPO | Admitting: Internal Medicine
